# Patient Record
Sex: Male | Born: 1975 | Race: Black or African American | Hispanic: No | Marital: Single | Smoking: Current every day smoker
Health system: Southern US, Community
[De-identification: ages and names within clinical notes are randomized; demographics above are authoritative.]

## PROBLEM LIST (undated history)

## (undated) DIAGNOSIS — F319 Bipolar disorder, unspecified: Secondary | ICD-10-CM

## (undated) DIAGNOSIS — F32A Depression, unspecified: Secondary | ICD-10-CM

## (undated) DIAGNOSIS — F419 Anxiety disorder, unspecified: Secondary | ICD-10-CM

## (undated) DIAGNOSIS — F329 Major depressive disorder, single episode, unspecified: Secondary | ICD-10-CM

## (undated) DIAGNOSIS — I10 Essential (primary) hypertension: Secondary | ICD-10-CM

## (undated) DIAGNOSIS — R011 Cardiac murmur, unspecified: Secondary | ICD-10-CM

---

## 2009-09-09 ENCOUNTER — Emergency Department (HOSPITAL_COMMUNITY): Admission: EM | Admit: 2009-09-09 | Discharge: 2009-09-09 | Payer: Self-pay | Admitting: Internal Medicine

## 2009-11-21 ENCOUNTER — Emergency Department (HOSPITAL_COMMUNITY): Admission: EM | Admit: 2009-11-21 | Discharge: 2009-11-21 | Payer: Self-pay | Admitting: Emergency Medicine

## 2014-04-23 ENCOUNTER — Emergency Department: Payer: Self-pay | Admitting: Emergency Medicine
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
## 2018-02-27 ENCOUNTER — Emergency Department
Admission: EM | Admit: 2018-02-27 | Discharge: 2018-02-28 | Disposition: A | Discharge status: Other Acute Inpatient Hospital | Payer: Medicare Other | Attending: Emergency Medicine | Admitting: Emergency Medicine

## 2018-02-27 ENCOUNTER — Encounter: Payer: Self-pay | Admitting: Emergency Medicine

## 2018-02-27 DIAGNOSIS — F209 Schizophrenia, unspecified: Secondary | ICD-10-CM | POA: Diagnosis present

## 2018-02-27 DIAGNOSIS — F29 Unspecified psychosis not due to a substance or known physiological condition: Secondary | ICD-10-CM

## 2018-02-27 DIAGNOSIS — F1020 Alcohol dependence, uncomplicated: Secondary | ICD-10-CM | POA: Insufficient documentation

## 2018-02-27 DIAGNOSIS — F1210 Cannabis abuse, uncomplicated: Secondary | ICD-10-CM | POA: Insufficient documentation

## 2018-02-27 LAB — URINE DRUG SCREEN, QUALITATIVE (ARMC ONLY)
Amphetamines, Ur Screen: NOT DETECTED
BARBITURATES, UR SCREEN: NOT DETECTED
Benzodiazepine, Ur Scrn: NOT DETECTED
CANNABINOID 50 NG, UR ~~LOC~~: POSITIVE — AB
COCAINE METABOLITE, UR ~~LOC~~: NOT DETECTED
MDMA (Ecstasy)Ur Screen: NOT DETECTED
Methadone Scn, Ur: NOT DETECTED
OPIATE, UR SCREEN: NOT DETECTED
PHENCYCLIDINE (PCP) UR S: NOT DETECTED
Tricyclic, Ur Screen: NOT DETECTED

## 2018-02-27 LAB — CBC WITH DIFFERENTIAL/PLATELET
Abs Immature Granulocytes: 0.06 10*3/uL (ref 0.00–0.07)
BASOS PCT: 0 %
Basophils Absolute: 0.1 10*3/uL (ref 0.0–0.1)
EOS ABS: 0.1 10*3/uL (ref 0.0–0.5)
Eosinophils Relative: 1 %
HCT: 46.6 % (ref 39.0–52.0)
Hemoglobin: 15.7 g/dL (ref 13.0–17.0)
IMMATURE GRANULOCYTES: 0 %
Lymphocytes Relative: 27 %
Lymphs Abs: 3.7 10*3/uL (ref 0.7–4.0)
MCH: 30.4 pg (ref 26.0–34.0)
MCHC: 33.7 g/dL (ref 30.0–36.0)
MCV: 90.1 fL (ref 80.0–100.0)
MONOS PCT: 5 %
Monocytes Absolute: 0.8 10*3/uL (ref 0.1–1.0)
NEUTROS ABS: 9.1 10*3/uL — AB (ref 1.7–7.7)
NRBC: 0 % (ref 0.0–0.2)
Neutrophils Relative %: 67 %
PLATELETS: 317 10*3/uL (ref 150–400)
RBC: 5.17 MIL/uL (ref 4.22–5.81)
RDW: 16.6 % — AB (ref 11.5–15.5)
WBC: 13.9 10*3/uL — AB (ref 4.0–10.5)

## 2018-02-27 LAB — URINALYSIS, COMPLETE (UACMP) WITH MICROSCOPIC
BACTERIA UA: NONE SEEN
BILIRUBIN URINE: NEGATIVE
GLUCOSE, UA: NEGATIVE mg/dL
Ketones, ur: NEGATIVE mg/dL
Leukocytes, UA: NEGATIVE
Nitrite: NEGATIVE
PH: 5 (ref 5.0–8.0)
Protein, ur: NEGATIVE mg/dL
SPECIFIC GRAVITY, URINE: 1.002 — AB (ref 1.005–1.030)
Squamous Epithelial / LPF: NONE SEEN (ref 0–5)

## 2018-02-27 LAB — ETHANOL: Alcohol, Ethyl (B): 153 mg/dL — ABNORMAL HIGH (ref <10)

## 2018-02-27 LAB — COMPREHENSIVE METABOLIC PANEL
ALBUMIN: 4.1 g/dL (ref 3.5–5.0)
ALT: 18 U/L (ref 0–44)
ANION GAP: 12 (ref 5–15)
AST: 24 U/L (ref 15–41)
Alkaline Phosphatase: 86 U/L (ref 38–126)
BUN: 9 mg/dL (ref 6–20)
CALCIUM: 9.1 mg/dL (ref 8.9–10.3)
CHLORIDE: 107 mmol/L (ref 98–111)
CO2: 20 mmol/L — AB (ref 22–32)
Creatinine, Ser: 0.83 mg/dL (ref 0.61–1.24)
GFR calc non Af Amer: >60 mL/min (ref >60)
GLUCOSE: 105 mg/dL — AB (ref 70–99)
POTASSIUM: 3.7 mmol/L (ref 3.5–5.1)
SODIUM: 139 mmol/L (ref 135–145)
Total Bilirubin: 0.4 mg/dL (ref 0.3–1.2)
Total Protein: 8.8 g/dL — ABNORMAL HIGH (ref 6.5–8.1)

## 2018-02-27 LAB — ACETAMINOPHEN LEVEL: Acetaminophen (Tylenol), Serum: <10 ug/mL — ABNORMAL LOW (ref 10–30)

## 2018-02-27 LAB — SALICYLATE LEVEL

## 2018-02-27 MED ORDER — HALOPERIDOL LACTATE 5 MG/ML IJ SOLN: 5.0000 mg | Freq: Once | INTRAMUSCULAR | Status: DC

## 2018-02-27 MED ORDER — LORAZEPAM 2 MG/ML IJ SOLN: 2.0000 mg | Freq: Once | INTRAMUSCULAR | Status: DC

## 2018-02-27 MED ORDER — HALOPERIDOL LACTATE 5 MG/ML IJ SOLN
10.0000 mg | Freq: Once | INTRAMUSCULAR | Status: DC
Start: 2018-02-27 — End: 2018-02-27

## 2018-02-27 MED ORDER — LORAZEPAM 2 MG/ML IJ SOLN
2.0000 mg | Freq: Once | INTRAMUSCULAR | Status: AC
  Administered 2018-02-27: 2 mg via INTRAMUSCULAR

## 2018-02-27 MED ORDER — HALOPERIDOL LACTATE 5 MG/ML IJ SOLN
10.0000 mg | Freq: Once | INTRAMUSCULAR | Status: AC
  Administered 2018-02-27: 10 mg via INTRAMUSCULAR

## 2018-02-27 NOTE — ED Notes (Signed)
Hourly rounding reveals patient in room sleeping. No complaints, stable, in no acute distress. Q15 minute rounds and monitoring via Rover and Officer to continue.   

## 2018-02-27 NOTE — ED Notes (Addendum)
Pt dressed out by Bennie Hind, NT and [employer], RN. Belongings include: white t-shirt, pair of white socks, jeans, belt, pair of shoes and wallet. Pt refusing to take off underwear. Per Hewan, RN underwear will stay on pt for now. Pts belongings correctly labeled and put in secure locked area.

## 2018-02-27 NOTE — ED Notes (Signed)
Pt. Transferred from Triage to room after dressing out and screening for contraband. Pt. Oriented to Quad including Q15 minute rounds as well as Psychologist, counselling for their protection. Patient is alert and oriented, warm and dry in no acute distress. Unable to assess SI, HI, and AVH. Pt. Encouraged to let me know if needs arise.
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
## 2018-02-28 ENCOUNTER — Inpatient Hospital Stay
Admission: AD | Admit: 2018-02-28 | Discharge: 2018-03-06 | DRG: 885 | Disposition: A | Discharge status: Home / Self Care | Payer: Medicare Other | Attending: Psychiatry | Admitting: Psychiatry

## 2018-02-28 ENCOUNTER — Other Ambulatory Visit: Payer: Self-pay

## 2018-02-28 DIAGNOSIS — F209 Schizophrenia, unspecified: Secondary | ICD-10-CM | POA: Diagnosis present

## 2018-02-28 DIAGNOSIS — F1020 Alcohol dependence, uncomplicated: Secondary | ICD-10-CM | POA: Diagnosis present

## 2018-02-28 DIAGNOSIS — F1210 Cannabis abuse, uncomplicated: Secondary | ICD-10-CM | POA: Diagnosis present

## 2018-02-28 DIAGNOSIS — F201 Disorganized schizophrenia: Secondary | ICD-10-CM | POA: Diagnosis not present

## 2018-02-28 DIAGNOSIS — F17210 Nicotine dependence, cigarettes, uncomplicated: Secondary | ICD-10-CM | POA: Diagnosis present

## 2018-02-28 DIAGNOSIS — F200 Paranoid schizophrenia: Secondary | ICD-10-CM | POA: Diagnosis present

## 2018-02-28 DIAGNOSIS — F22 Delusional disorders: Secondary | ICD-10-CM | POA: Diagnosis present

## 2018-02-28 DIAGNOSIS — I10 Essential (primary) hypertension: Secondary | ICD-10-CM | POA: Diagnosis present

## 2018-02-28 DIAGNOSIS — Z79899 Other long term (current) drug therapy: Secondary | ICD-10-CM | POA: Diagnosis not present

## 2018-02-28 DIAGNOSIS — R45850 Homicidal ideations: Secondary | ICD-10-CM | POA: Diagnosis present

## 2018-02-28 HISTORY — DX: Bipolar disorder, unspecified: F31.9

## 2018-02-28 HISTORY — DX: Anxiety disorder, unspecified: F41.9

## 2018-02-28 HISTORY — DX: Cardiac murmur, unspecified: R01.1

## 2018-02-28 HISTORY — DX: Essential (primary) hypertension: I10

## 2018-02-28 HISTORY — DX: Depression, unspecified: F32.A

## 2018-02-28 HISTORY — DX: Major depressive disorder, single episode, unspecified: F32.9

## 2018-02-28 MED ORDER — ALUM & MAG HYDROXIDE-SIMETH 200-200-20 MG/5ML PO SUSP: 30.0000 mL | ORAL | Status: DC | PRN

## 2018-02-28 MED ORDER — MAGNESIUM HYDROXIDE 400 MG/5ML PO SUSP
30.0000 mL | Freq: Every day | ORAL | Status: DC | PRN
  Administered 2018-03-03: 30 mL via ORAL
  Filled 2018-02-28: qty 30

## 2018-02-28 MED ORDER — HALOPERIDOL 5 MG PO TABS: 5.0000 mg | ORAL_TABLET | Freq: Four times a day (QID) | ORAL | Status: DC | PRN

## 2018-02-28 MED ORDER — HALOPERIDOL 5 MG PO TABS
5.0000 mg | ORAL_TABLET | Freq: Three times a day (TID) | ORAL | Status: DC
  Filled 2018-02-28: qty 1

## 2018-02-28 MED ORDER — ACETAMINOPHEN 325 MG PO TABS
650.0000 mg | ORAL_TABLET | Freq: Four times a day (QID) | ORAL | Status: DC | PRN
  Administered 2018-03-01 – 2018-03-02 (×2): 650 mg via ORAL
  Filled 2018-02-28 (×2): qty 2

## 2018-02-28 MED ORDER — HYDROXYZINE HCL 25 MG PO TABS
25.0000 mg | ORAL_TABLET | Freq: Three times a day (TID) | ORAL | Status: DC | PRN
  Administered 2018-03-02 – 2018-03-03 (×2): 25 mg via ORAL
  Filled 2018-02-28 (×3): qty 1

## 2018-02-28 MED ORDER — HALOPERIDOL LACTATE 5 MG/ML IJ SOLN
5.0000 mg | Freq: Four times a day (QID) | INTRAMUSCULAR | Status: DC | PRN
  Filled 2018-02-28: qty 1

## 2018-02-28 MED ORDER — LORAZEPAM 2 MG PO TABS
2.0000 mg | ORAL_TABLET | Freq: Four times a day (QID) | ORAL | Status: DC | PRN
  Administered 2018-03-01: 2 mg via ORAL
  Filled 2018-02-28: qty 1

## 2018-02-28 MED ORDER — HALOPERIDOL 5 MG PO TABS
5.0000 mg | ORAL_TABLET | Freq: Three times a day (TID) | ORAL | Status: DC
  Administered 2018-03-01 – 2018-03-02 (×4): 5 mg via ORAL
  Filled 2018-02-28 (×5): qty 1

## 2018-02-28 MED ORDER — LORAZEPAM 2 MG/ML IJ SOLN
2.0000 mg | Freq: Four times a day (QID) | INTRAMUSCULAR | Status: DC | PRN
  Filled 2018-02-28: qty 1

## 2018-02-28 NOTE — Progress Notes (Signed)
Patient ID: Larry Walter, male   DOB: 1975-05-04, 43 y.o.   MRN: [id number] Per State regulations 482.30 this chart was reviewed for medical necessity with respect to the patient's admission/duration of stay.    Next review date: 03/04/2018  Thurman Coyer, BSN, RN-BC  Case Manager

## 2018-02-28 NOTE — ED Provider Notes (Signed)
-----------------------------------------   5:01 AM on 02/28/2018 -----------------------------------------   Blood pressure (!) 136/100, pulse (!) 103, temperature 97.7 F (36.5 C), temperature source Oral, resp. rate 18, SpO2 94 %.  The patient is calm and cooperative at this time.  There have been no acute events since the last update.  Awaiting disposition plan from Behavioral Medicine team.    Loleta Rose, MD 02/28/18 1324

## 2018-02-28 NOTE — ED Notes (Signed)
Hourly rounding reveals patient in room. No complaints, stable, in no acute distress. Q15 minute rounds and monitoring via Rover and Officer to continue.   

## 2018-02-28 NOTE — ED Notes (Signed)
Patient talking to Dr. Viviano Simas

## 2018-02-28 NOTE — BH Assessment (Signed)
Patient is to be admitted to [facility] by Dr. Viviano Simas.  Attending Physician will be Dr. Toni Amend.   Patient has been assigned to room 314, by [facility] Charge Nurse Gwen.   Intake Paper Work has been signed and placed on patient chart.  ER staff is aware of the admission:  Irving Burton, ER Secretary    Dr. Alphonzo Lemmings, ER MD   Geralynn Ochs, Patient's Nurse   Jacklyn Shell, Patient Access.

## 2018-02-28 NOTE — Tx Team (Signed)
Initial Treatment Plan 02/28/2018 11:36 PM Lysbeth Galas Chrisandra Carota [id number]    PATIENT STRESSORS: Financial difficulties Health problems Medication change or noncompliance Substance abuse   PATIENT STRENGTHS: Capable of independent living Physical Health Work skills   PATIENT IDENTIFIED PROBLEMS:   Drug and Alcohol miss use    Depression/Anxiety    Hopelessness/despair    Unemployed and broke       DISCHARGE CRITERIA:  Adequate post-discharge living arrangements Improved stabilization in mood, thinking, and/or behavior Medical problems require only outpatient monitoring Safe-care adequate arrangements made  PRELIMINARY DISCHARGE PLAN: Attend PHP/IOP Attend 12-step recovery group Outpatient therapy Participate in family therapy  PATIENT/FAMILY INVOLVEMENT: This treatment plan has been presented to and reviewed with the patient, Mourice Calmes,   The patient have been given the opportunity to ask questions and make suggestions.  Lelan Pons, RN 02/28/2018, 11:36 PM

## 2018-02-28 NOTE — ED Notes (Signed)
Spoke to [employer] from patients [facility] (ACT Team) and she stated patient received his Haldol Decanoate 100mg , February 12, 2018

## 2018-02-28 NOTE — ED Notes (Signed)
Patient refused his Haldol 5mg  PO medication, patient says he has already taken his Haldol injection this month, and will not let us (staff) keep pumping him up full of medication.

## 2018-02-28 NOTE — ED Notes (Signed)
This nurse and outgoing nurse spoke with patient about in-patient services.  Pt. Would rather go home, but asking to speak with doctor.  Dr. Informed will talk to patient.

## 2018-02-28 NOTE — ED Notes (Signed)
Hourly rounding reveals patient sleeping in room. No complaints, stable, in no acute distress. Q15 minute rounds and monitoring via Security to continue. 
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
## 2018-03-01 DIAGNOSIS — F200 Paranoid schizophrenia: Principal | ICD-10-CM

## 2018-03-01 MED ORDER — LORAZEPAM 2 MG PO TABS: 2.0000 mg | ORAL_TABLET | ORAL | Status: DC | PRN

## 2018-03-01 MED ORDER — HALOPERIDOL LACTATE 5 MG/ML IJ SOLN
5.0000 mg | Freq: Three times a day (TID) | INTRAMUSCULAR | Status: DC | PRN
  Filled 2018-03-01: qty 1

## 2018-03-01 MED ORDER — TRAZODONE HCL 100 MG PO TABS
100.0000 mg | ORAL_TABLET | Freq: Every evening | ORAL | Status: DC | PRN
  Administered 2018-03-01 – 2018-03-04 (×4): 100 mg via ORAL
  Filled 2018-03-01 (×4): qty 1

## 2018-03-01 MED ORDER — LORAZEPAM 2 MG/ML IJ SOLN
2.0000 mg | INTRAMUSCULAR | Status: DC | PRN
  Administered 2018-03-05: 2 mg via INTRAMUSCULAR
  Filled 2018-03-01: qty 1
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
## 2018-03-01 NOTE — Progress Notes (Signed)
Recreation Therapy Notes    Date: 03/01/2018  Time: 9:30 am   Location: Craft room   Behavioral response: N/A   Intervention Topic: Relaxation  Discussion/Intervention: Patient did not attend group.   Clinical Observations/Feedback:  Patient did not attend group.   Nickson Middlesworth LRT/CTRS          Raeford Brandenburg 03/01/2018 11:40 AM
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
## 2018-03-01 NOTE — Progress Notes (Addendum)
New admit to the unit, present under involuntary commitment by his Act team , patient is characterized by disorganized thinking ,present  emotional and behavioral out bust , speaks vulgar with F word  lack cooperation with care staff , demands the nurse to go and buy for him marijuana and beer before he can  cooperate with any thing, his affects  are often incongruent with current situation . Patient refused to contract for safety and refused to respond to any of assessment questions, rather displaying paranoia, stating that "I don't belong in here" "You all mother F..."  "don't mess with my head". Patient is placed in room 314 where he is safe and free of any harm to himself and safety of other residents. Patient has expresses thoughts of hurting people but does not have a plan  And states no intention of hurting anyone, patient is escorted to his room for deescalating and safety.

## 2018-03-01 NOTE — [facility] Group Notes (Signed)
Balance In Life 03/01/2018 1PM  Type of Therapy/Topic:  Group Therapy:  Balance in Life  Participation Level:  Did Not Attend  Description of Group:   This group will address the concept of balance and how it feels and looks when one is unbalanced. Patients will be encouraged to process areas in their lives that are out of balance and identify reasons for remaining unbalanced. Facilitators will guide patients in utilizing problem-solving interventions to address and correct the stressor making their life unbalanced. Understanding and applying boundaries will be explored and addressed for obtaining and maintaining a balanced life. Patients will be encouraged to explore ways to assertively make their unbalanced needs known to significant others in their lives, using other group members and facilitator for support and feedback.  Therapeutic Goals: 1. Patient will identify two or more emotions or situations they have that consume much of in their lives. 2. Patient will identify signs/triggers that life has become out of balance:  3. Patient will identify two ways to set boundaries in order to achieve balance in their lives:  4. Patient will demonstrate ability to communicate their needs through discussion and/or role plays  Summary of Patient Progress:    Therapeutic Modalities:   Cognitive Behavioral Therapy Solution-Focused Therapy Assertiveness Training  Larry Walter Larry Cristie Mckinney, LCSW   
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
## 2018-03-02 MED ORDER — HALOPERIDOL 5 MG PO TABS
10.0000 mg | ORAL_TABLET | Freq: Three times a day (TID) | ORAL | Status: DC
  Administered 2018-03-02 – 2018-03-05 (×9): 10 mg via ORAL
  Filled 2018-03-02 (×12): qty 2

## 2018-03-02 NOTE — Progress Notes (Signed)
Recreation Therapy Notes  Date: 03/02/2018  Time: 9:30 am   Location: Craft room   Behavioral response: N/A   Intervention Topic: Leisure  Discussion/Intervention: Patient did not attend group.   Clinical Observations/Feedback:  Patient did not attend group.   Kymberlyn Eckford LRT/CTRS        Meshilem Machuca 03/02/2018 10:31 AM

## 2018-03-02 NOTE — Progress Notes (Signed)
Patient alert and oriented x 4, denies SI/HI/AVH, patient's mood is irritable and he appears restless. Patient isolated to self in his room, he c/o of body pain and he was medicated with standing order as needed. Patient 's thoughts are organized coherent, he stated " l am really tired" . Patient was receptive to treatment plan and compliant with medication. 15 minutes safety checks was maintained will continue to monitor.

## 2018-03-02 NOTE — Plan of Care (Signed)
Pt. Complaint with medications. Pt. Denies si/hi/avh, contracts for safety. Pt. Forwards little and is minimal often. Pt. Participation in unit activities and groups absent. Pt. Attends meals alone and does not like to be around others.      Problem: Medication: Goal: Compliance with prescribed medication regimen will improve Outcome: Progressing   Problem: Self-Concept: Goal: Ability to disclose and discuss suicidal ideas will improve Outcome: Progressing   Problem: Coping: Goal: Will verbalize feelings Outcome: Not Progressing   Problem: Activity: Goal: Will identify at least one activity in which they can participate Outcome: Not Progressing

## 2018-03-02 NOTE — Progress Notes (Signed)
[facility] MD Progress Note  03/02/2018 7:02 PM Larry Walter  MRN:  [id number] Subjective: Follow-up for this patient with schizophrenia.  Patient met with treatment team today.  His thoughts are very disorganized and scattered.  Makes bizarre statements.  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  At the same time switching subjects to even more bizarre things.  Patient became agitated during the interview repeatedly stating that he wanted [place] to call him a cab so that he could get out of the hospital immediately.  At one point started cracking his knuckles and looking like he might actually become aggressive we needed to end the interview.  Fortunately he is currently cooperative with the Haldol and is not showing any side effects from it. Principal Problem: Schizophrenia ([place]) Diagnosis: Principal Problem:   Schizophrenia ([place]) Active Problems:   Marijuana abuse, continuous   Alcohol use disorder, severe, dependence ([place])  Total Time spent with patient: 30 minutes  Past Psychiatric History: Patient has a history of long-standing schizophrenia.  Recent decompensation possibly related to drug abuse although that also seems to be a chronic issue.  Has been more paranoid and agitated recently.  Past Medical History:  Past Medical History:  Diagnosis Date  . Anxiety   . Bipolar disorder ([place])   . Depression   . Heart murmur   . Hypertension    History reviewed. No pertinent surgical history. Family History: History reviewed. No pertinent family history. Family Psychiatric  History: None known Social History:  Social History   Substance and Sexual Activity  Alcohol Use Not on file     Social History   Substance and Sexual Activity  Drug Use Not on file    Social History   Socioeconomic History  . Marital status: Single    Spouse name: Not on file  . Number of children: Not on file  . Years of education: Not on file  . Highest  education level: Not on file  Occupational History  . Not on file  Social Needs  . Financial resource strain: Not on file  . Food insecurity:    Worry: Not on file    Inability: Not on file  . Transportation needs:    Medical: Not on file    Non-medical: Not on file  Tobacco Use  . Smoking status: Current Every Day Smoker  . Smokeless tobacco: Former Network engineer and Sexual Activity  . Alcohol use: Not on file  . Drug use: Not on file  . Sexual activity: Not on file  Lifestyle  . Physical activity:    Days per week: Not on file    Minutes per session: Not on file  . Stress: Not on file  Relationships  . Social connections:    Talks on phone: Not on file    Gets together: Not on file    Attends religious service: Not on file    Active member of club or organization: Not on file    Attends meetings of clubs or organizations: Not on file    Relationship status: Not on file  Other Topics Concern  . Not on file  Social History Narrative  . Not on file   Additional Social History:                         Sleep: Fair  Appetite:  Fair  Current Medications: Current Facility-Administered Medications  Medication Dose  Route Frequency Provider Last Rate Last Dose  . acetaminophen (TYLENOL) tablet 650 mg  650 mg Oral Q6H PRN Lavella Hammock, MD   650 mg at 03/02/18 1038  . alum & mag hydroxide-simeth (MAALOX/MYLANTA) 200-200-20 MG/5ML suspension 30 mL  30 mL Oral Q4H PRN Lavella Hammock, MD      . haloperidol (HALDOL) tablet 10 mg  10 mg Oral TID , Madie Reno, MD   10 mg at 03/02/18 1657  . haloperidol (HALDOL) tablet 5 mg  5 mg Oral Q6H PRN Lavella Hammock, MD       Or  . haloperidol lactate (HALDOL) injection 5 mg  5 mg Intramuscular Q6H PRN Lavella Hammock, MD      . haloperidol lactate (HALDOL) injection 5 mg  5 mg Intramuscular TID PRN , Madie Reno, MD      . hydrOXYzine (ATARAX/VISTARIL) tablet 25 mg  25 mg Oral TID PRN Lavella Hammock, MD      .  LORazepam (ATIVAN) tablet 2 mg  2 mg Oral Q6H PRN Lavella Hammock, MD   2 mg at 03/01/18 2118   Or  . LORazepam (ATIVAN) injection 2 mg  2 mg Intramuscular Q6H PRN Lavella Hammock, MD      . LORazepam (ATIVAN) tablet 2 mg  2 mg Oral Q4H PRN , Madie Reno, MD       Or  . LORazepam (ATIVAN) injection 2 mg  2 mg Intramuscular Q4H PRN ,  T, MD      . magnesium hydroxide (MILK OF MAGNESIA) suspension 30 mL  30 mL Oral Daily PRN Lavella Hammock, MD      . traZODone (DESYREL) tablet 100 mg  100 mg Oral QHS PRN , Madie Reno, MD   100 mg at 03/01/18 2118    Lab Results: No results found for this or any previous visit (from the past 48 hour(s)).  Blood Alcohol level:  Lab Results  Component Value Date   ETH 153 (H) 95/63/8756    Metabolic Disorder Labs: No results found for: HGBA1C, MPG No results found for: PROLACTIN No results found for: CHOL, TRIG, HDL, CHOLHDL, VLDL, LDLCALC  Physical Findings: AIMS: Facial and Oral Movements Muscles of Facial Expression: None, normal Lips and Perioral Area: None, normal Jaw: None, normal Tongue: None, normal,Extremity Movements Upper (arms, wrists, hands, fingers): None, normal Lower (legs, knees, ankles, toes): None, normal, Trunk Movements Neck, shoulders, hips: None, normal, Overall Severity Severity of abnormal movements (highest score from questions above): None, normal Incapacitation due to abnormal movements: None, normal Patient's awareness of abnormal movements (rate only patient's report): No Awareness, Dental Status Current problems with teeth and/or dentures?: No Does patient usually wear dentures?: No  CIWA:  CIWA-Ar Total: 2 COWS:  COWS Total Score: 1  Musculoskeletal: Strength & Muscle Tone: within normal limits Gait & Station: normal Patient leans: N/A  Psychiatric Specialty Exam: Physical Exam  Nursing note and vitals reviewed. Constitutional: He appears well-developed and well-nourished.  HENT:  Head:  Normocephalic and atraumatic.  Eyes: Pupils are equal, round, and reactive to light. Conjunctivae are normal.  Neck: Normal range of motion.  Cardiovascular: Regular rhythm and normal heart sounds.  Respiratory: Effort normal. No respiratory distress.  GI: Soft.  Musculoskeletal: Normal range of motion.  Neurological: He is alert.  Skin: Skin is warm and dry.  Psychiatric: His affect is labile and inappropriate. His speech is tangential. He is agitated and aggressive. Thought content is paranoid and delusional.  Cognition and memory are impaired. He expresses impulsivity and inappropriate judgment. He is noncommunicative.    Review of Systems  Constitutional: Negative.   HENT: Negative.   Eyes: Negative.   Respiratory: Negative.   Cardiovascular: Negative.   Gastrointestinal: Negative.   Musculoskeletal: Negative.   Skin: Negative.   Neurological: Negative.   Psychiatric/Behavioral: Negative.     Blood pressure 112/80, pulse (!) 120, temperature 98.2 F (36.8 C), temperature source Oral, resp. rate 16, height '6\' 1"'  (1.854 m), weight 83.9 kg, SpO2 99 %.Body mass index is 24.41 kg/m.  General Appearance: Casual  Eye Contact:  Fair  Speech:  Garbled and Slurred  Volume:  Increased  Mood:  Angry and Irritable  Affect:  Congruent  Thought Process:  Disorganized  Orientation:  Negative  Thought Content:  Illogical, Delusions, Paranoid Ideation and Rumination  Suicidal Thoughts:  No  Homicidal Thoughts:  Yes.  without intent/plan  Memory:  Immediate;   Fair Recent;   Poor Remote;   Poor  Judgement:  Impaired  Insight:  Lacking  Psychomotor Activity:  Restlessness  Concentration:  Concentration: Fair  Recall:  [employer] of Knowledge:  Fair  Language:  Fair  Akathisia:  No  Handed:  Right  AIMS (if indicated):     Assets:  Desire for Improvement Housing Physical Health Resilience  ADL's:  Intact  Cognition:  Impaired,  Mild  Sleep:  Number of Hours: 7.5      Treatment Plan Summary: Daily contact with patient to assess and evaluate symptoms and progress in treatment, Medication management and Plan Patient was schizophrenia remains disorganized paranoid agitated and at times slightly threatening although he is not getting involved in any aggressive behavior during the day today.  I have doubled his Haldol dose to 10 mg 3 times a day which he seems to be tolerating without any difficulty.  Patient has made a clear many times that he does not want to take any other medicines than this so given his agitation I did not broach the subject.  Patient is going to need to be in the hospital at least well into next week I would guess.  Alethia Berthold, MD 03/02/2018, 7:02 PM
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
## 2018-03-02 NOTE — Tx Team (Addendum)
Interdisciplinary Treatment and Diagnostic Plan Update  03/02/2018 Time of Session:2:00pm Tritan Riegsecker MRN: [id number]  Principal Diagnosis: Schizophrenia [facility])  Secondary Diagnoses: Principal Problem:   Schizophrenia (HCC) Active Problems:   Marijuana abuse, continuous   Alcohol use disorder, severe, dependence (HCC)   Current Medications:  Current Facility-Administered Medications  Medication Dose Route Frequency Provider Last Rate Last Dose  . acetaminophen (TYLENOL) tablet 650 mg  650 mg Oral Q6H PRN Mariel Craft, MD   650 mg at 03/02/18 1038  . alum & mag hydroxide-simeth (MAALOX/MYLANTA) 200-200-20 MG/5ML suspension 30 mL  30 mL Oral Q4H PRN Mariel Craft, MD      . haloperidol (HALDOL) tablet 10 mg  10 mg Oral TID Clapacs, Jackquline Denmark, MD      . haloperidol (HALDOL) tablet 5 mg  5 mg Oral Q6H PRN Mariel Craft, MD       Or  . haloperidol lactate (HALDOL) injection 5 mg  5 mg Intramuscular Q6H PRN Mariel Craft, MD      . haloperidol lactate (HALDOL) injection 5 mg  5 mg Intramuscular TID PRN Clapacs, Jackquline Denmark, MD      . hydrOXYzine (ATARAX/VISTARIL) tablet 25 mg  25 mg Oral TID PRN Mariel Craft, MD      . LORazepam (ATIVAN) tablet 2 mg  2 mg Oral Q6H PRN Mariel Craft, MD   2 mg at 03/01/18 2118   Or  . LORazepam (ATIVAN) injection 2 mg  2 mg Intramuscular Q6H PRN Mariel Craft, MD      . LORazepam (ATIVAN) tablet 2 mg  2 mg Oral Q4H PRN Clapacs, Jackquline Denmark, MD       Or  . LORazepam (ATIVAN) injection 2 mg  2 mg Intramuscular Q4H PRN Clapacs, John T, MD      . magnesium hydroxide (MILK OF MAGNESIA) suspension 30 mL  30 mL Oral Daily PRN Mariel Craft, MD      . traZODone (DESYREL) tablet 100 mg  100 mg Oral QHS PRN Clapacs, Jackquline Denmark, MD   100 mg at 03/01/18 2118   PTA Medications: No medications prior to admission.    Patient Stressors: Financial difficulties Health problems Medication change or noncompliance Substance abuse  Patient Strengths:  Capable of independent living Physical Health Work skills  Treatment Modalities: Medication Management, Group therapy, Case management,  1 to 1 session with clinician, Psychoeducation, Recreational therapy.   Physician Treatment Plan for Primary Diagnosis: Schizophrenia (HCC) Long Term Goal(s): Improvement in symptoms so as ready for discharge Improvement in symptoms so as ready for discharge   Short Term Goals: Ability to verbalize feelings will improve Ability to demonstrate self-control will improve Compliance with prescribed medications will improve  Medication Management: Evaluate patient's response, side effects, and tolerance of medication regimen.  Therapeutic Interventions: 1 to 1 sessions, Unit Group sessions and Medication administration.  Evaluation of Outcomes: Not Progressing  Physician Treatment Plan for Secondary Diagnosis: Principal Problem:   Schizophrenia (HCC) Active Problems:   Marijuana abuse, continuous   Alcohol use disorder, severe, dependence (HCC)  Long Term Goal(s): Improvement in symptoms so as ready for discharge Improvement in symptoms so as ready for discharge   Short Term Goals: Ability to verbalize feelings will improve Ability to demonstrate self-control will improve Compliance with prescribed medications will improve     Medication Management: Evaluate patient's response, side effects, and tolerance of medication regimen.  Therapeutic Interventions: 1 to 1 sessions, Unit Group sessions and  Medication administration.  Evaluation of Outcomes: Not Progressing   RN Treatment Plan for Primary Diagnosis: Schizophrenia (HCC) Long Term Goal(s): Knowledge of disease and therapeutic regimen to maintain health will improve  Short Term Goals: Ability to verbalize frustration and anger appropriately will improve, Ability to identify and develop effective coping behaviors will improve and Compliance with prescribed medications will  improve  Medication Management: RN will administer medications as ordered by provider, will assess and evaluate patient's response and provide education to patient for prescribed medication. RN will report any adverse and/or side effects to prescribing provider.  Therapeutic Interventions: 1 on 1 counseling sessions, Psychoeducation, Medication administration, Evaluate responses to treatment, Monitor vital signs and CBGs as ordered, Perform/monitor CIWA, COWS, AIMS and Fall Risk screenings as ordered, Perform wound care treatments as ordered.  Evaluation of Outcomes: Not Progressing   LCSW Treatment Plan for Primary Diagnosis: Schizophrenia (HCC) Long Term Goal(s): Safe transition to appropriate next level of care at discharge, Engage patient in therapeutic group addressing interpersonal concerns.  Short Term Goals: Engage patient in aftercare planning with referrals and resources, Increase social support, Increase ability to appropriately verbalize feelings and Increase skills for wellness and recovery  Therapeutic Interventions: Assess for all discharge needs, 1 to 1 time with Social worker, Explore available resources and support systems, Assess for adequacy in community support network, Educate family and significant other(s) on suicide prevention, Complete Psychosocial Assessment, Interpersonal group therapy.  Evaluation of Outcomes: Not Progressing   Progress in Treatment: Attending groups: No. Participating in groups: No. Taking medication as prescribed: Yes. Toleration medication: Yes. Family/Significant other contact made: No, will contact:  Once CSW obtains permission from pt.   Patient understands diagnosis: No. Discussing patient identified problems/goals with staff: Yes. Medical problems stabilized or resolved: Yes. Denies suicidal/homicidal ideation: Yes. Issues/concerns per patient self-inventory: No. Other: none    New problem(s) identified: No, Describe:  none  New  Short Term/Long Term Goal(s):  Patient Goals:  "getting a cab and getting out of here"  Discharge Plan or Barriers:   Reason for Continuation of Hospitalization: Aggression Delusions  Hallucinations Medication stabilization  Estimated Length of Stay:  Recreational Therapy: Patient Stressors: N/A  Patient Goal: Patient will identify 3 healthy leisure activities that can be utilized post d/c within 5 recreation therapy group sessions  Attendees: Patient: Tobi BastosKenneth Dinino 03/02/2018 3:19 PM  Physician: Dr. Toni Amendlapacs, MD 03/02/2018 3:19 PM  Nursing:  03/02/2018 3:19 PM  RN Care Manager: 03/02/2018 3:19 PM  Social Worker:  Penni HomansMichaela Stanfield, MSW, LCSW 03/02/2018 3:19 PM  Recreational Therapist: Hilbert BibleShay Miya Luviano, CTRS, LRT 03/02/2018 3:19 PM  Other:  03/02/2018 3:19 PM  Other:  03/02/2018 3:19 PM  Other: 03/02/2018 3:19 PM    Scribe for Treatment Team: Harden MoMichaela J Stanfield, LCSW 03/02/2018 3:19 PM

## 2018-03-02 NOTE — Plan of Care (Signed)
  Problem: Education: Goal: Utilization of techniques to improve thought processes will improve Outcome: Progressing  Patient able to express thoughts to staff.

## 2018-03-03 DIAGNOSIS — F209 Schizophrenia, unspecified: Secondary | ICD-10-CM

## 2018-03-03 NOTE — Progress Notes (Signed)
[facility] MD Progress Note  03/03/2018 3:00 PM Larry Walter  MRN:  [id number] Subjective: Follow-up for this patient with schizophrenia.    Pt reportedly isolative, labile, irrigable. Pt  Lysing in bed, dysphoric., guarded, makes poor eye contact, denies AVH, Denies SI/HI, pt upset with staff checking him at night disturbing his sleep.   Delusional about having  a lot of money.  Principal Problem: Schizophrenia (HCC) Diagnosis: Principal Problem:   Schizophrenia (HCC) Active Problems:   Marijuana abuse, continuous   Alcohol use disorder, severe, dependence (HCC)  Total Time spent with patient: 25 min  Past Psychiatric History: Patient has a history of long-standing schizophrenia.  Recent decompensation possibly related to drug abuse although that also seems to be a chronic issue.  Has been more paranoid and agitated recently.  Past Medical History:  Past Medical History:  Diagnosis Date  . Anxiety   . Bipolar disorder (HCC)   . Depression   . Heart murmur   . Hypertension    History reviewed. No pertinent surgical history. Family History: History reviewed. No pertinent family history. Family Psychiatric  History: None known Social History:  Social History   Substance and Sexual Activity  Alcohol Use Not on file     Social History   Substance and Sexual Activity  Drug Use Not on file    Social History   Socioeconomic History  . Marital status: Single    Spouse name: Not on file  . Number of children: Not on file  . Years of education: Not on file  . Highest education level: Not on file  Occupational History  . Not on file  Social Needs  . Financial resource strain: Not on file  . Food insecurity:    Worry: Not on file    Inability: Not on file  . Transportation needs:    Medical: Not on file    Non-medical: Not on file  Tobacco Use  . Smoking status: Current Every Day Smoker  . Smokeless tobacco: Former Engineer, water and Sexual Activity  . Alcohol  use: Not on file  . Drug use: Not on file  . Sexual activity: Not on file  Lifestyle  . Physical activity:    Days per week: Not on file    Minutes per session: Not on file  . Stress: Not on file  Relationships  . Social connections:    Talks on phone: Not on file    Gets together: Not on file    Attends religious service: Not on file    Active member of club or organization: Not on file    Attends meetings of clubs or organizations: Not on file    Relationship status: Not on file  Other Topics Concern  . Not on file  Social History Narrative  . Not on file   Additional Social History:                         Sleep: Fair  Appetite:  Fair  Current Medications: Current Facility-Administered Medications  Medication Dose Route Frequency Provider Last Rate Last Dose  . acetaminophen (TYLENOL) tablet 650 mg  650 mg Oral Q6H PRN Mariel Craft, MD   650 mg at 03/02/18 1038  . alum & mag hydroxide-simeth (MAALOX/MYLANTA) 200-200-20 MG/5ML suspension 30 mL  30 mL Oral Q4H PRN Mariel Craft, MD      . haloperidol (HALDOL) tablet 10 mg  10 mg Oral TID Clapacs, John  T, MD   10 mg at 03/03/18 1201  . haloperidol (HALDOL) tablet 5 mg  5 mg Oral Q6H PRN Mariel CraftMaurer, Sheila M, MD       Or  . haloperidol lactate (HALDOL) injection 5 mg  5 mg Intramuscular Q6H PRN Mariel CraftMaurer, Sheila M, MD      . haloperidol lactate (HALDOL) injection 5 mg  5 mg Intramuscular TID PRN Clapacs, Jackquline DenmarkJohn T, MD      . hydrOXYzine (ATARAX/VISTARIL) tablet 25 mg  25 mg Oral TID PRN Mariel CraftMaurer, Sheila M, MD   25 mg at 03/02/18 2150  . LORazepam (ATIVAN) tablet 2 mg  2 mg Oral Q6H PRN Mariel CraftMaurer, Sheila M, MD   2 mg at 03/01/18 2118   Or  . LORazepam (ATIVAN) injection 2 mg  2 mg Intramuscular Q6H PRN Mariel CraftMaurer, Sheila M, MD      . LORazepam (ATIVAN) tablet 2 mg  2 mg Oral Q4H PRN Clapacs, Jackquline DenmarkJohn T, MD       Or  . LORazepam (ATIVAN) injection 2 mg  2 mg Intramuscular Q4H PRN Clapacs, John T, MD      . magnesium hydroxide  (MILK OF MAGNESIA) suspension 30 mL  30 mL Oral Daily PRN Mariel CraftMaurer, Sheila M, MD   30 mL at 03/03/18 1317  . traZODone (DESYREL) tablet 100 mg  100 mg Oral QHS PRN Clapacs, Jackquline DenmarkJohn T, MD   100 mg at 03/02/18 2150    Lab Results: No results found for this or any previous visit (from the past 48 hour(s)).  Blood Alcohol level:  Lab Results  Component Value Date   ETH 153 (H) 02/27/2018    Metabolic Disorder Labs: No results found for: HGBA1C, MPG No results found for: PROLACTIN No results found for: CHOL, TRIG, HDL, CHOLHDL, VLDL, LDLCALC  Physical Findings: AIMS: Facial and Oral Movements Muscles of Facial Expression: None, normal Lips and Perioral Area: None, normal Jaw: None, normal Tongue: None, normal,Extremity Movements Upper (arms, wrists, hands, fingers): None, normal Lower (legs, knees, ankles, toes): None, normal, Trunk Movements Neck, shoulders, hips: None, normal, Overall Severity Severity of abnormal movements (highest score from questions above): None, normal Incapacitation due to abnormal movements: None, normal Patient's awareness of abnormal movements (rate only patient's report): No Awareness, Dental Status Current problems with teeth and/or dentures?: No Does patient usually wear dentures?: No  CIWA:  CIWA-Ar Total: 2 COWS:  COWS Total Score: 1  Musculoskeletal: Strength & Muscle Tone: within normal limits Gait & Station: normal Patient leans: N/A  Psychiatric Specialty Exam: Physical Exam  Nursing note and vitals reviewed. Constitutional: He appears well-developed and well-nourished.  HENT:  Head: Normocephalic and atraumatic.  Cardiovascular: Regular rhythm and normal heart sounds.  Respiratory: Effort normal. No respiratory distress.  GI: Soft.  Neurological: He is alert.  Psychiatric: His affect is labile and inappropriate. His speech is tangential. He is agitated and aggressive. Thought content is paranoid and delusional. Cognition and memory are  impaired. He expresses impulsivity and inappropriate judgment. He is noncommunicative.    Review of Systems  Constitutional: Negative.   HENT: Negative.   Eyes: Negative.   Respiratory: Negative.   Cardiovascular: Negative.   Gastrointestinal: Negative.   Musculoskeletal: Negative.   Skin: Negative.   Neurological: Negative.   Psychiatric/Behavioral: Negative.     Blood pressure 106/77, pulse 98, temperature 98.9 F (37.2 C), temperature source Oral, resp. rate 16, height 6\' 1"  (1.854 m), weight 83.9 kg, SpO2 98 %.Body mass index is 24.41 kg/m.  General  Appearance: Casual  Eye Contact:  Fair  Speech:  Garbled and Slurred  Volume:  Increased  Mood:  Angry and Irritable  Affect:  Congruent  Thought Process:  Disorganized  Orientation:  Negative  Thought Content:  Illogical, Delusions, Paranoid Ideation and Rumination, delusional  Suicidal Thoughts:  No  Homicidal Thoughts:  denies  Memory:  Immediate;   Fair Recent;   Poor Remote;   Poor  Judgement:  Impaired  Insight:  Lacking  Psychomotor Activity: normal  Concentration:  Concentration: Fair  Recall:  Fis[employer]Fair  Fund of Knowledge:  Fair  Language:  Fair  Akathisia:  No  Handed:  Right  AIMS (if indicated):     Assets:  Desire for Improvement Housing Physical Health Resilience  ADL's:  Intact  Cognition:  Impaired,  Mild  Sleep:  Number of Hours: 6.15     Treatment Plan Summary:  Pt with schizophrenia, still psychotic delusional, labile mood. Plan-  Cont haldol. EKG pending Group/milieu tx as tolerated.   Beverly SessionsJagannath Aleera Gilcrease, MD 03/03/2018, 3:00 PM

## 2018-03-03 NOTE — Plan of Care (Signed)
Pt. Complaint with medications. Pt. Denies si/hi/avh, contracts for safety. Pt. Reports he can remain safe while on the unit and if he discharges.    Problem: Medication: Goal: Compliance with prescribed medication regimen will improve Outcome: Progressing   Problem: Self-Concept: Goal: Ability to disclose and discuss suicidal ideas will improve Outcome: Progressing

## 2018-03-03 NOTE — [facility] Group Notes (Signed)
LCSW Group Therapy Note  03/03/2018 1:15pm  Type of Therapy and Topic:  Group Therapy:  Fears and Unhealthy Coping Skills  Participation Level:  Did Not Attend   Description of Group:  The focus of this group was to discuss some of the prevalent fears that patients experience, and to identify the commonalities among group members.  An exercise was used to initiate the discussion, followed by writing on the white board a group-generated list of unhealthy coping and healthy coping techniques to deal with each fear.    Therapeutic Goals: 1. Patient will identify and describe 3 fears they experience 2. Patient will identify one positive coping strategy for each fear they experience 3. Patient will respond empathically to peers statements regarding fears they experience  Summary of Patient Progress:  The patient was invited to group and did not attend.    Therapeutic Modalities Cognitive Behavioral Therapy Motivational Interviewing  Raequon Catanzaro  CUEBAS-COLON, LCSW 03/03/2018 12:22 PM

## 2018-03-03 NOTE — Plan of Care (Signed)
  Problem: Education: Goal: Utilization of techniques to improve thought processes will improve Outcome: Progressing Goal: Knowledge of the prescribed therapeutic regimen will improve Outcome: Progressing   Problem: Coping: Goal: Coping ability will improve Outcome: Progressing Goal: Will verbalize feelings Outcome: Progressing   Problem: Medication: Goal: Compliance with prescribed medication regimen will improve Outcome: Progressing   Problem: Self-Concept: Goal: Ability to disclose and discuss suicidal ideas will improve Outcome: Progressing Goal: Will verbalize positive feelings about self Outcome: Progressing

## 2018-03-03 NOTE — Progress Notes (Signed)
D: Pt denies SI/HI/AV hallucinations. Pt has been ambulating in hall and room. Patient has been in milieu with peers and staff. Patient is somewhat guarded but cooperative. A: Pt was offered support and encouragement. Pt was given scheduled medications. Pt was encourage to attend groups. Q 15 minute checks were done for safety.  R:Pt attends groups and interacts well with peers and staff. Pt is taking medication. Pt has no complaints.Pt receptive to treatment and safety maintained on unit.

## 2018-03-03 NOTE — Progress Notes (Signed)
Patient is responding well to treatment regimen , calm and verbalizing positive feeling about self , showing an increase in self esteem and self worth,  minimal participation and socialization with peers. Take his medications no side effects noted.contract for safety and denies any SI/HI/AVH, sleep is continuous and 15 minutes safety checks is maintained.

## 2018-03-03 NOTE — Progress Notes (Signed)
D: Pt denies SI/HI/AVH, can contract for safety. Pt is pleasant and cooperative, but minimal and forwards little. Pt. Complains of not being able to have a, "good size poop" and requests PRN medication for this. Pt. Later in the shift reports he was able to have a large bowel movement. Patient Interactions are minimal. Pt. Affect brighter overall, smiling occassionally. Pt. Continues to be discharge fixated.    A: Q x 15 minute observation checks were completed for safety. Patient was provided with education, but needs reinforcement. Patient was given/offered medications per orders. Patient  was encourage to attend groups, participate in unit activities and continue with plan of care. Pt. Chart and plans of care reviewed. Pt. Given support and encouragement.   R: Patient is complaint with medications and attends all meals, eating good. Pt. Less isolative and withdrawn from peers when attending meals. Pt. Does continues to not attend groups or unit activities. Pt. Continues to pace and/or spend a majority of his time isolative and withdrawn.             Precautionary checks every 15 minutes for safety maintained, room free of safety hazards, patient sustains no injury or falls during this shift. Will endorse care to next shift.

## 2018-03-04 NOTE — Progress Notes (Signed)
D- Patient alert and oriented. Patient presents in a pleasant mood on assessment stating that he slept "good enough" last night and had no major complaints or concerns to voice to this Clinical research associate. Patient denies any signs/symptoms of depression and anxiety to this Clinical research associate. Patient also denies SI, HI, AVH, and pain at this time. Patient's goal for today is to "mind my business".  A- Scheduled medications administered to patient, per MD orders. Support and encouragement provided.  Routine safety checks conducted every 15 minutes.  Patient informed to notify staff with problems or concerns.  R- No adverse drug reactions noted. Patient contracts for safety at this time. Patient compliant with medications and treatment plan. Patient receptive, calm, and cooperative. Patient interacts well with others on the unit.  Patient remains safe at this time.

## 2018-03-04 NOTE — [facility] Group Notes (Signed)
LCSW Group Therapy Note 03/04/2018 1:15pm  Type of Therapy and Topic: Group Therapy: Feelings Around Returning Home & Establishing a Supportive Framework and Supporting Oneself When Supports Not Available  Participation Level: Did Not Attend  Description of Group:  Patients first processed thoughts and feelings about upcoming discharge. These included fears of upcoming changes, lack of change, new living environments, judgements and expectations from others and overall stigma of mental health issues. The group then discussed the definition of a supportive framework, what that looks and feels like, and how do to discern it from an unhealthy non-supportive network. The group identified different types of supports as well as what to do when your family/friends are less than helpful or unavailable  Therapeutic Goals  1. Patient will identify one healthy supportive network that they can use at discharge. 2. Patient will identify one factor of a supportive framework and how to tell it from an unhealthy network. 3. Patient able to identify one coping skill to use when they do not have positive supports from others. 4. Patient will demonstrate ability to communicate their needs through discussion and/or role plays.  Summary of Patient Progress:  Pt was invited to attend group but chose not to attend. CSW will continue to encourage pt to attend group throughout their admission.    Therapeutic Modalities Cognitive Behavioral Therapy Motivational Interviewing   Larry Walter  CUEBAS-COLON, LCSW 03/04/2018 9:57 AM  

## 2018-03-04 NOTE — Plan of Care (Signed)
Pt. Verbalizes feeling appropriately. Pt. Reports normal mood. Pt. Complaint with medications. Pt. Denies si/hi/avh, can contract for safety.    Problem: Coping: Goal: Will verbalize feelings Outcome: Progressing   Problem: Medication: Goal: Compliance with prescribed medication regimen will improve Outcome: Progressing   Problem: Self-Concept: Goal: Ability to disclose and discuss suicidal ideas will improve Outcome: Progressing

## 2018-03-04 NOTE — Progress Notes (Signed)
Patient ID: Larry Walter, male   DOB: January 22, 1976, 43 y.o.   MRN: [id number] Per State regulations 482.30 this chart was reviewed for medical necessity with respect to the patient's admission/duration of stay.    Next review date:03/08/2018  Thurman Coyer, BSN, RN-BC  Case Manager

## 2018-03-04 NOTE — Progress Notes (Signed)
[facility] MD Progress Note  03/04/2018 1:18 PM Larry Walter  MRN:  [id number] Subjective: Follow-up for this patient with schizophrenia.   " Im doing fine, Im going home tomorrow" Pt reportedly isolative, mood and psychosis improving slowly,  guarded,  denies AVH, Denies SI/HI,   Principal Problem: Schizophrenia (HCC) Diagnosis: Principal Problem:   Schizophrenia (HCC) Active Problems:   Marijuana abuse, continuous   Alcohol use disorder, severe, dependence (HCC)  Total Time spent with patient: 25 min  Past Psychiatric History: Patient has a history of long-standing schizophrenia.  Recent decompensation possibly related to drug abuse although that also seems to be a chronic issue.  Has been more paranoid and agitated recently.  Past Medical History:  Past Medical History:  Diagnosis Date  . Anxiety   . Bipolar disorder (HCC)   . Depression   . Heart murmur   . Hypertension    History reviewed. No pertinent surgical history. Family History: History reviewed. No pertinent family history. Family Psychiatric  History: None known Social History:  Social History   Substance and Sexual Activity  Alcohol Use Not on file     Social History   Substance and Sexual Activity  Drug Use Not on file    Social History   Socioeconomic History  . Marital status: Single    Spouse name: Not on file  . Number of children: Not on file  . Years of education: Not on file  . Highest education level: Not on file  Occupational History  . Not on file  Social Needs  . Financial resource strain: Not on file  . Food insecurity:    Worry: Not on file    Inability: Not on file  . Transportation needs:    Medical: Not on file    Non-medical: Not on file  Tobacco Use  . Smoking status: Current Every Day Smoker  . Smokeless tobacco: Former Engineer, water and Sexual Activity  . Alcohol use: Not on file  . Drug use: Not on file  . Sexual activity: Not on file  Lifestyle  . Physical  activity:    Days per week: Not on file    Minutes per session: Not on file  . Stress: Not on file  Relationships  . Social connections:    Talks on phone: Not on file    Gets together: Not on file    Attends religious service: Not on file    Active member of club or organization: Not on file    Attends meetings of clubs or organizations: Not on file    Relationship status: Not on file  Other Topics Concern  . Not on file  Social History Narrative  . Not on file   Additional Social History:                         Sleep: Fair  Appetite:  Fair  Current Medications: Current Facility-Administered Medications  Medication Dose Route Frequency Provider Last Rate Last Dose  . acetaminophen (TYLENOL) tablet 650 mg  650 mg Oral Q6H PRN Mariel Craft, MD   650 mg at 03/02/18 1038  . alum & mag hydroxide-simeth (MAALOX/MYLANTA) 200-200-20 MG/5ML suspension 30 mL  30 mL Oral Q4H PRN Mariel Craft, MD      . haloperidol (HALDOL) tablet 10 mg  10 mg Oral TID Clapacs, Jackquline Denmark, MD   10 mg at 03/04/18 1131  . haloperidol (HALDOL) tablet 5 mg  5  mg Oral Q6H PRN Mariel CraftMaurer, Sheila M, MD       Or  . haloperidol lactate (HALDOL) injection 5 mg  5 mg Intramuscular Q6H PRN Mariel CraftMaurer, Sheila M, MD      . haloperidol lactate (HALDOL) injection 5 mg  5 mg Intramuscular TID PRN Clapacs, Jackquline DenmarkJohn T, MD      . hydrOXYzine (ATARAX/VISTARIL) tablet 25 mg  25 mg Oral TID PRN Mariel CraftMaurer, Sheila M, MD   25 mg at 03/03/18 2132  . LORazepam (ATIVAN) tablet 2 mg  2 mg Oral Q6H PRN Mariel CraftMaurer, Sheila M, MD   2 mg at 03/01/18 2118   Or  . LORazepam (ATIVAN) injection 2 mg  2 mg Intramuscular Q6H PRN Mariel CraftMaurer, Sheila M, MD      . LORazepam (ATIVAN) tablet 2 mg  2 mg Oral Q4H PRN Clapacs, Jackquline DenmarkJohn T, MD       Or  . LORazepam (ATIVAN) injection 2 mg  2 mg Intramuscular Q4H PRN Clapacs, John T, MD      . magnesium hydroxide (MILK OF MAGNESIA) suspension 30 mL  30 mL Oral Daily PRN Mariel CraftMaurer, Sheila M, MD   30 mL at 03/03/18 1317   . traZODone (DESYREL) tablet 100 mg  100 mg Oral QHS PRN Clapacs, Jackquline DenmarkJohn T, MD   100 mg at 03/03/18 2133    Lab Results: No results found for this or any previous visit (from the past 48 hour(s)).  Blood Alcohol level:  Lab Results  Component Value Date   ETH 153 (H) 02/27/2018    Metabolic Disorder Labs: No results found for: HGBA1C, MPG No results found for: PROLACTIN No results found for: CHOL, TRIG, HDL, CHOLHDL, VLDL, LDLCALC  Physical Findings: AIMS: Facial and Oral Movements Muscles of Facial Expression: None, normal Lips and Perioral Area: None, normal Jaw: None, normal Tongue: None, normal,Extremity Movements Upper (arms, wrists, hands, fingers): None, normal Lower (legs, knees, ankles, toes): None, normal, Trunk Movements Neck, shoulders, hips: None, normal, Overall Severity Severity of abnormal movements (highest score from questions above): None, normal Incapacitation due to abnormal movements: None, normal Patient's awareness of abnormal movements (rate only patient's report): No Awareness, Dental Status Current problems with teeth and/or dentures?: No Does patient usually wear dentures?: No  CIWA:  CIWA-Ar Total: 2 COWS:  COWS Total Score: 1  Musculoskeletal: Strength & Muscle Tone: within normal limits Gait & Station: normal Patient leans: N/A  Psychiatric Specialty Exam: Physical Exam  Nursing note and vitals reviewed. Constitutional: He appears well-developed and well-nourished.  Respiratory: Effort normal. No respiratory distress.  Neurological: He is alert.  Psychiatric: His affect is labile and inappropriate. His speech is tangential. He is agitated and aggressive. Thought content is paranoid and delusional. Cognition and memory are impaired. He expresses impulsivity and inappropriate judgment. He is noncommunicative.    Review of Systems  Constitutional: Negative.   HENT: Negative.   Eyes: Negative.   Respiratory: Negative.   Cardiovascular:  Negative.   Gastrointestinal: Negative.   Musculoskeletal: Negative.   Skin: Negative.   Neurological: Negative.   Psychiatric/Behavioral: Negative.     Blood pressure 112/79, pulse 94, temperature 99.7 F (37.6 C), temperature source Oral, resp. rate 16, height 6\' 1"  (1.854 m), weight 83.9 kg, SpO2 100 %.Body mass index is 24.41 kg/m.  General Appearance: Casual, isolative  Eye Contact:  Fair  Speech:  Garbled and Slurred  Volume:  Increased  Mood:  Angry and Irritable  Affect:  Congruent  Thought Process:  Disorganized  Orientation:  Negative  Thought Content:  Illogical, Delusions, Paranoid Ideation and Rumination, delusional  Suicidal Thoughts:  No  Homicidal Thoughts:  denies  Memory:  Immediate;   Fair Recent;   Poor Remote;   Poor  Judgement:  Impaired  Insight:  Lacking  Psychomotor Activity: normal  Concentration:  Concentration: Fair  Recall:  [employer] of Knowledge:  Fair  Language:  Fair  Akathisia:  No  Handed:  Right  AIMS (if indicated):     Assets:  Desire for Improvement Housing Physical Health Resilience  ADL's:  Intact  Cognition:  Impaired,  Mild  Sleep:  Number of Hours: 6.15     Treatment Plan Summary:  Pt with schizophrenia, isolative but mood and psychosis improving.  Plan-  Cont haldol. EKG pending Group/milieu tx as tolerated.  Cont tx plan  Beverly Sessions, MD 03/04/2018, 1:18 PM

## 2018-03-04 NOTE — Progress Notes (Signed)
D: Pt denies SI/HI/AVH, can contract for safety. Pt is pleasant and cooperative, engages appropriately, but still minimal overall. Pt. has no Complaints, just continues to be discharge fixated. Patient Interactions are appropriate. Pt. Observed walking around the milieu often, not interacting much. Pt. Reports normal mood. Pt. Affect still mostly flat.   A: Q x 15 minute observation checks were completed for safety. Patient was provided with education, but needs reinforcement. Patient was given/offered medications per orders. Patient  was encourage to attend groups, participate in unit activities and continue with plan of care. Pt. Chart and plans of care reviewed. Pt. Given support and encouragement.   R: Patient is complaint with medication and unit procedures. Pt. Attends snack time, eating good. Pt. Given sleep-aid requested. No behavioral concerns to report.             Precautionary checks every 15 minutes for safety maintained, room free of safety hazards, patient sustains no injury or falls during this shift. Will endorse care to next shift.

## 2018-03-04 NOTE — Plan of Care (Signed)
Patient verbalizes understanding of his prescribed therapeutic regimen and has been in compliance since this afternoon. Patient denies SI/HI/AVH as well as any depression and anxiety. Patient has been present in the milieu as well as outside in the courtyard with staff and other members on the unit without any issues. Patient has the ability to make decisions regarding his health-care and has been in compliance with his prescribed therapeutic regimen and has not voiced any further questions/concerns at this time. Patient has been free from injury thus far on the unit and remains safe at this time.  Problem: Education: Goal: Utilization of techniques to improve thought processes will improve Outcome: Progressing Goal: Knowledge of the prescribed therapeutic regimen will improve Outcome: Progressing   Problem: Coping: Goal: Coping ability will improve Outcome: Progressing Goal: Will verbalize feelings Outcome: Progressing   Problem: Activity: Goal: Will identify at least one activity in which they can participate Outcome: Progressing   Problem: Coping: Goal: Demonstration of participation in decision-making regarding own care will improve Outcome: Progressing   Problem: Education: Goal: Ability to make informed decisions regarding treatment will improve Outcome: Progressing   Problem: Medication: Goal: Compliance with prescribed medication regimen will improve Outcome: Progressing   Problem: Self-Concept: Goal: Ability to disclose and discuss suicidal ideas will improve Outcome: Progressing Goal: Will verbalize positive feelings about self Outcome: Progressing

## 2018-03-05 MED ORDER — HALOPERIDOL DECANOATE 100 MG/ML IM SOLN
200.0000 mg | Freq: Once | INTRAMUSCULAR | Status: AC
  Administered 2018-03-06: 200 mg via INTRAMUSCULAR
  Filled 2018-03-05: qty 2

## 2018-03-05 MED ORDER — HALOPERIDOL 5 MG PO TABS: 5.0000 mg | ORAL_TABLET | Freq: Four times a day (QID) | ORAL | Status: DC | PRN

## 2018-03-05 MED ORDER — INFLUENZA VAC SPLIT QUAD 0.5 ML IM SUSY: 0.5000 mL | PREFILLED_SYRINGE | INTRAMUSCULAR | Status: DC

## 2018-03-05 MED ORDER — DIPHENHYDRAMINE HCL 50 MG/ML IJ SOLN
50.0000 mg | Freq: Once | INTRAMUSCULAR | Status: AC
  Administered 2018-03-05: 50 mg via INTRAMUSCULAR

## 2018-03-05 MED ORDER — HALOPERIDOL LACTATE 5 MG/ML IJ SOLN
10.0000 mg | Freq: Four times a day (QID) | INTRAMUSCULAR | Status: DC | PRN
  Administered 2018-03-05: 10 mg via INTRAMUSCULAR

## 2018-03-05 MED ORDER — DIPHENHYDRAMINE HCL 50 MG/ML IJ SOLN
INTRAMUSCULAR | Status: AC
  Administered 2018-03-05: 50 mg via INTRAMUSCULAR
  Filled 2018-03-05: qty 1

## 2018-03-05 NOTE — [facility] Group Notes (Signed)
LCSW Group Therapy Note   03/05/2018 1:00 PM  Type of Therapy and Topic:  Group Therapy:  Overcoming Obstacles   Participation Level:  None   Description of Group:    In this group patients will be encouraged to explore what they see as obstacles to their own wellness and recovery. They will be guided to discuss their thoughts, feelings, and behaviors related to these obstacles. The group will process together ways to cope with barriers, with attention given to specific choices patients can make. Each patient will be challenged to identify changes they are motivated to make in order to overcome their obstacles. This group will be process-oriented, with patients participating in exploration of their own experiences as well as giving and receiving support and challenge from other group members.   Therapeutic Goals: 1. Patient will identify personal and current obstacles as they relate to admission. 2. Patient will identify barriers that currently interfere with their wellness or overcoming obstacles.  3. Patient will identify feelings, thought process and behaviors related to these barriers. 4. Patient will identify two changes they are willing to make to overcome these obstacles:      Summary of Patient Progress Patient attended group, however did not participate.  Patient arrived to group late and only remained for a few minutes before he left.   Therapeutic Modalities:   Cognitive Behavioral Therapy Solution Focused Therapy Motivational Interviewing Relapse Prevention Therapy  Penni Homans, MSW, LCSW 03/05/2018 3:56 PM

## 2018-03-05 NOTE — Progress Notes (Signed)
Advised Dr. Toni Amend that 10 mg haldol held at 1700 due to sedation.

## 2018-03-05 NOTE — Progress Notes (Signed)
Recreation Therapy Notes  Date: 03/05/2018  Time: 9:30 am  Location: [facility]  Behavioral response: Appropriate  Intervention Topic: Emotions  Discussion/Intervention:  Group content on today was focused on emotions. The group identified what emotions are and why it is important to have emotions. Patients expressed some positive and negative emotions. Individuals gave some past experiences on how they normally dealt with emotions in the past. The group described some positive ways to deal with emotions in the future. Patients participated in the intervention "Name the [name]" where individuals were given a chance to experience different emotions.  Clinical Observations/Feedback:  Patient came to group late due to unknown reasons. Individual was social with peers and staff while participating in the intervention. Banjamin Stovall LRT/CTRS         Makyiah Lie 03/05/2018 10:56 AM

## 2018-03-05 NOTE — Progress Notes (Signed)
   03/05/18 0100  Clinical Encounter Type  Visited With Patient  Visit Type Initial;Follow-up;Spiritual support  Referral From Nurse  Spiritual Encounters  Spiritual Needs Emotional;Other (Comment)

## 2018-03-05 NOTE — Plan of Care (Signed)
D: Patient presents with flat, blunted affect.  He denies any thoughts of self harm today.  He can be isolative to room, however, he has been observed in the milieu.  Patient is compliant with his medications.  Patient is no physical distress; his mood appears stable.  He is quiet; forwards little.  He has minimal interaction with  his peers.  Patient is a possible discharge today per MD.  A: Continue to monitor medication management and MD orders.  Safety checks completed every 15 minutes per protocol.  Offer support and encouragement as needed.  R: Patient is receptive to staff; his behavior is appropriate.     Problem: Education: Goal: Utilization of techniques to improve thought processes will improve Outcome: Adequate for Discharge Goal: Knowledge of the prescribed therapeutic regimen will improve Outcome: Adequate for Discharge   Problem: Coping: Goal: Coping ability will improve Outcome: Adequate for Discharge Goal: Will verbalize feelings Outcome: Adequate for Discharge   Problem: Activity: Goal: Will identify at least one activity in which they can participate Outcome: Adequate for Discharge   Problem: Coping: Goal: Demonstration of participation in decision-making regarding own care will improve Outcome: Adequate for Discharge   Problem: Education: Goal: Ability to make informed decisions regarding treatment will improve Outcome: Adequate for Discharge   Problem: Medication: Goal: Compliance with prescribed medication regimen will improve Outcome: Adequate for Discharge   Problem: Self-Concept: Goal: Ability to disclose and discuss suicidal ideas will improve Outcome: Adequate for Discharge Goal: Will verbalize positive feelings about self Outcome: Adequate for Discharge

## 2018-03-05 NOTE — [facility] Group Notes (Signed)
[facility] Group Notes:  (Nursing/MHT/Case Management/Adjunct)  Date:  03/05/2018  Time:  10:06 PM  Type of Therapy:  Group Therapy  Participation Level:  Did Not Attend   Mayra Neer 03/05/2018, 10:06 PM

## 2018-03-05 NOTE — Progress Notes (Signed)
Patient in the hallway and is clearly upset.  He is cursing and yelling.  Patient is marking verbal threats against MD.  He states, "I gonna get up [place] of this MF place or something's gonna happen.  How come that MF won't let me go today? What's the difference between today and tomorrow?"  Attempted to deescalate patient verbally, however, he become more agitated.  He was not aggressive toward staff, however, kept making threats that he was "gonna do something."  Patient was visible from nurse's station; he had security and MHT with him.  Informed MD of patient's escalating behavior and received order for IM injections.  Patient was not receptive to injections, refusing them at first.  Law enforcement came onto the unit and spoke with patient about cooperating with staff and taking the injection.  Patient allowed me to give injections in right deltoid.  Patient cooperated and staff did not have to lay hands on patient.  Explained to patient that his threatening behavior was upsetting his peers, and the injections were for safety of the milieu.  Patient tolerated injections well.  He is currently asleep in his bed.

## 2018-03-05 NOTE — Progress Notes (Signed)
[facility]  03/05/2018 7:16 PM Larry Walter  MRN:  [id number] Subjective:  Continues to be paranoid with poor insight. Easily angered Required forced medication in the afternoon after beconing angry. No change to medical needs Principal Problem: Schizophrenia (HCC) Diagnosis: Principal Problem:   Schizophrenia (HCC) Active Problems:   Marijuana abuse, continuous   Alcohol use disorder, severe, dependence (HCC)  Total Time spent with patient: 30 minutes  Past Psychiatric History: Schizophrenia  Past Medical History:  Past Medical History:  Diagnosis Date  . Anxiety   . Bipolar disorder (HCC)   . Depression   . Heart murmur   . Hypertension    History reviewed. No pertinent surgical history. Family History: History reviewed. No pertinent family history. Family Psychiatric  History: one Social History:  Social History   Substance and Sexual Activity  Alcohol Use Not on file     Social History   Substance and Sexual Activity  Drug Use Not on file    Social History   Socioeconomic History  . Marital status: Single    Spouse name: Not on file  . Number of children: Not on file  . Years of education: Not on file  . Highest education level: Not on file  Occupational History  . Not on file  Social Needs  . Financial resource strain: Not on file  . Food insecurity:    Worry: Not on file    Inability: Not on file  . Transportation needs:    Medical: Not on file    Non-medical: Not on file  Tobacco Use  . Smoking status: Current Every Day Smoker  . Smokeless tobacco: Former Engineer, waterUser  Substance and Sexual Activity  . Alcohol use: Not on file  . Drug use: Not on file  . Sexual activity: Not on file  Lifestyle  . Physical activity:    Days per week: Not on file    Minutes per session: Not on file  . Stress: Not on file  Relationships  . Social connections:    Talks on phone: Not on file    Gets together: Not on file    Attends religious service:  Not on file    Active member of club or organization: Not on file    Attends meetings of clubs or organizations: Not on file    Relationship status: Not on file  Other Topics Concern  . Not on file  Social History Narrative  . Not on file   Additional Social History:                         Sleep: Fair  Appetite:  Fair  Current Medications: Current Facility-Administered Medications  Medication Dose Route Frequency Provider Last Rate Last Dose  . acetaminophen (TYLENOL) tablet 650 mg  650 mg Oral Q6H PRN Mariel CraftMaurer, Sheila M, MD   650 mg at 03/02/18 1038  . alum & mag hydroxide-simeth (MAALOX/MYLANTA) 200-200-20 MG/5ML suspension 30 mL  30 mL Oral Q4H PRN Mariel CraftMaurer, Sheila M, MD      . haloperidol (HALDOL) tablet 10 mg  10 mg Oral TID Clapacs, Jackquline DenmarkJohn T, MD   Stopped at 03/05/18 1637  . haloperidol lactate (HALDOL) injection 10 mg  10 mg Intramuscular Q6H PRN Clapacs, John T, MD   10 mg at 03/05/18 1440   Or  . haloperidol (HALDOL) tablet 5 mg  5 mg Oral Q6H PRN Clapacs, Jackquline DenmarkJohn T, MD      .  haloperidol lactate (HALDOL) injection 5 mg  5 mg Intramuscular TID PRN Clapacs, John T, MD      . hydrOXYzine (ATARAX/VISTARIL) tablet 25 mg  25 mg Oral TID PRN Mariel CraftMaurer, Sheila M, MD   25 mg at 03/03/18 2132  . [START ON 03/06/2018] Influenza vac split quadrivalent PF (FLUARIX) injection 0.5 mL  0.5 mL Intramuscular Tomorrow-1000 Clapacs, John T, MD      . LORazepam (ATIVAN) tablet 2 mg  2 mg Oral Q6H PRN Mariel CraftMaurer, Sheila M, MD   2 mg at 03/01/18 2118   Or  . LORazepam (ATIVAN) injection 2 mg  2 mg Intramuscular Q6H PRN Mariel CraftMaurer, Sheila M, MD      . LORazepam (ATIVAN) tablet 2 mg  2 mg Oral Q4H PRN Clapacs, Jackquline DenmarkJohn T, MD       Or  . LORazepam (ATIVAN) injection 2 mg  2 mg Intramuscular Q4H PRN Clapacs, Jackquline DenmarkJohn T, MD   2 mg at 03/05/18 1441  . magnesium hydroxide (MILK OF MAGNESIA) suspension 30 mL  30 mL Oral Daily PRN Mariel CraftMaurer, Sheila M, MD   30 mL at 03/03/18 1317  . traZODone (DESYREL) tablet 100 mg  100 mg  Oral QHS PRN Clapacs, Jackquline DenmarkJohn T, MD   100 mg at 03/04/18 2053    Lab Results: No results found for this or any previous visit (from the past 48 hour(s)).  Blood Alcohol level:  Lab Results  Component Value Date   ETH 153 (H) 02/27/2018    Metabolic Disorder Labs: No results found for: HGBA1C, MPG No results found for: PROLACTIN No results found for: CHOL, TRIG, HDL, CHOLHDL, VLDL, LDLCALC  Physical Findings: AIMS: Facial and Oral Movements Muscles of Facial Expression: None, normal Lips and Perioral Area: None, normal Jaw: None, normal Tongue: None, normal,Extremity Movements Upper (arms, wrists, hands, fingers): None, normal Lower (legs, knees, ankles, toes): None, normal, Trunk Movements Neck, shoulders, hips: None, normal, Walter Severity Severity of abnormal movements (highest score from questions above): None, normal Incapacitation due to abnormal movements: None, normal Patient's awareness of abnormal movements (rate only patient's report): No Awareness, Dental Status Current problems with teeth and/or dentures?: No Does patient usually wear dentures?: No  CIWA:  CIWA-Ar Total: 2 COWS:  COWS Total Score: 1  Musculoskeletal: Strength & Muscle Tone: within normal limits Gait & Station: normal Patient leans: N/A  Psychiatric Specialty Exam: Physical Exam  Nursing note and vitals reviewed. Constitutional: He appears well-developed and well-nourished.  HENT:  Head: Normocephalic and atraumatic.  Eyes: Pupils are equal, round, and reactive to light. Conjunctivae are normal.  Neck: Normal range of motion.  Cardiovascular: Regular rhythm and normal heart sounds.  Respiratory: Effort normal.  GI: Soft.  Musculoskeletal: Normal range of motion.  Neurological: He is alert.  Skin: Skin is warm and dry.  Psychiatric: His affect is labile. His speech is tangential. He is agitated. Thought content is paranoid and delusional. Cognition and memory are impaired. He expresses  impulsivity.    Review of Systems  Constitutional: Negative.   HENT: Negative.   Eyes: Negative.   Respiratory: Negative.   Cardiovascular: Negative.   Gastrointestinal: Negative.   Musculoskeletal: Negative.   Skin: Negative.   Neurological: Negative.   Psychiatric/Behavioral: Negative.     Blood pressure 118/79, pulse 94, temperature 98.1 F (36.7 C), temperature source Oral, resp. rate 16, height 6\' 1"  (1.854 m), weight 83.9 kg, SpO2 100 %.Body mass index is 24.41 kg/m.  General Appearance: Casual  Eye Contact:  Fair  Speech:  Clear and Coherent  Volume:  Normal  Mood:  Angry and Irritable  Affect:  Inappropriate and Labile  Thought Process:  Disorganized  Orientation:  Full (Time, Place, and Person)  Thought Content:  Illogical, Delusions and Paranoid Ideation  Suicidal Thoughts:  No  Homicidal Thoughts:  Yes.  without intent/plan  Memory:  Immediate;   Fair Recent;   Fair Remote;   Fair  Judgement:  Poor  Insight:  Lacking  Psychomotor Activity:  Increased  Concentration:  Concentration: Poor  Recall:  Poor  Fund of Knowledge:  Poor  Language:  Poor  Akathisia:  Negative  Handed:  Right  AIMS (if indicated):     Assets:  Housing Physical Health Resilience  ADL's:  Impaired  Cognition:  Impaired,  Mild  Sleep:  Number of Hours: 6.15     Treatment Plan Summary: Daily contact with patient to assess and evaluate symptoms and progress in treatment, Medication management and Plan Increase haldol with haldol shot today if possible. Blood pressure stable. PLan for possible discharge 1-2 days  Mordecai Rasmussen, MD 03/05/2018, 7:16 PM

## 2018-03-06 MED ORDER — HALOPERIDOL DECANOATE 100 MG/ML IM SOLN: 200.0000 mg | INTRAMUSCULAR | 1 refills | Status: DC

## 2018-03-06 MED ORDER — TRAZODONE HCL 100 MG PO TABS: 100.0000 mg | ORAL_TABLET | Freq: Every evening | ORAL | 0 refills | Status: DC | PRN

## 2018-03-06 MED ORDER — HALOPERIDOL 10 MG PO TABS: 10.0000 mg | ORAL_TABLET | Freq: Two times a day (BID) | ORAL | 0 refills | Status: DC

## 2018-03-06 NOTE — Progress Notes (Signed)
D: Pt denies SI/HI/AVH, can contract for safety. Pt. Is observed in his room for assessments. Pt. Reports just wanting to sleep. Pt. Mood appears dysphoric. Pt. Contact is minimal. Pt. Forwards little. Pt. Discharge fixated.  A: Q x 15 minute observation checks were completed for safety. Patient was attempted to be provided with education, but refused. Patient was given/offered medications per orders. Patient  was encourage to attend groups, participate in unit activities and continue with plan of care. Pt. Chart and plans of care reviewed. Pt. Given support and encouragement.   R: Patient is isolative and withdrawn to his room the entire shift. Pt. Does not attend snack time and or group.           Precautionary checks every 15 minutes for safety maintained, room free of safety hazards, patient sustains no injury or falls during this shift. Will endorse care to next shift.

## 2018-03-06 NOTE — Discharge Summary (Signed)
Physician Discharge Summary Note  Patient:  Larry Walter is an 43 y.o., male MRN:  [id number] DOB:  04-17-1975 Patient phone:  [phone] (home)  Patient address:   [address] [place] [address],  Total Time spent with patient: 45 minutes  Date of Admission:  02/28/2018 Date of Discharge: March 06, 2018  Reason for Admission: Patient admitted through the emergency room after petition for agitated paranoid threatening behavior  Principal Problem: Schizophrenia [facility]) Discharge Diagnoses: Principal Problem:   Schizophrenia ([place]) Active Problems:   Marijuana abuse, continuous   Alcohol use disorder, severe, dependence ([place])   Past Psychiatric History: Patient has a history of schizophrenia along stable on IM Haldol Decanoate  Past Medical History:  Past Medical History:  Diagnosis Date  . Anxiety   . Bipolar disorder ([place])   . Depression   . Heart murmur   . Hypertension    History reviewed. No pertinent surgical history. Family History: History reviewed. No pertinent family history. Family Psychiatric  History: None known Social History:  Social History   Substance and Sexual Activity  Alcohol Use Not on file     Social History   Substance and Sexual Activity  Drug Use Not on file    Social History   Socioeconomic History  . Marital status: Single    Spouse name: Not on file  . Number of children: Not on file  . Years of education: Not on file  . Highest education level: Not on file  Occupational History  . Not on file  Social Needs  . Financial resource strain: Not on file  . Food insecurity:    Worry: Not on file    Inability: Not on file  . Transportation needs:    Medical: Not on file    Non-medical: Not on file  Tobacco Use  . Smoking status: Current Every Day Smoker  . Smokeless tobacco: Former Network engineer and Sexual Activity  . Alcohol use: Not on file  . Drug use: Not on file  . Sexual activity: Not on file  Lifestyle   . Physical activity:    Days per week: Not on file    Minutes per session: Not on file  . Stress: Not on file  Relationships  . Social connections:    Talks on phone: Not on file    Gets together: Not on file    Attends religious service: Not on file    Active member of club or organization: Not on file    Attends meetings of clubs or organizations: Not on file    Relationship status: Not on file  Other Topics Concern  . Not on file  Social History Narrative  . Not on file    Hospital Course: Patient admitted to the hospital because of increased agitation and threatening behavior outside the hospital.  Met with treatment team.  Treatment team communicated with act team.  Communicated with patient's mother.  Established that patient had been more paranoid and agitated recently.  Also had been using more marijuana recently.  Patient continued on Haldol and dose was increased.  Received Haldol decanoate 200 mg IM which was an increased dose prior to discharge.  Just charged on that as well as some oral Haldol.  Patient had some periods of agitation but was not violent or threatening at discharge.  Had one episode of getting into a fight with another patient although the blame seems to have been mutual.  At this point denies suicidal or  homicidal ideation.  Appears to be able to take care of ADLs.  Has been cooperative with counseling about the negative impact of substance abuse.  Agrees to continue to follow-up with his act team.  Physical Findings: AIMS: Facial and Oral Movements Muscles of Facial Expression: None, normal Lips and Perioral Area: None, normal Jaw: None, normal Tongue: None, normal,Extremity Movements Upper (arms, wrists, hands, fingers): None, normal Lower (legs, knees, ankles, toes): None, normal, Trunk Movements Neck, shoulders, hips: None, normal, Overall Severity Severity of abnormal movements (highest score from questions above): None, normal Incapacitation due to  abnormal movements: None, normal Patient's awareness of abnormal movements (rate only patient's report): No Awareness, Dental Status Current problems with teeth and/or dentures?: No Does patient usually wear dentures?: No  CIWA:  CIWA-Ar Total: 2 COWS:  COWS Total Score: 1  Musculoskeletal: Strength & Muscle Tone: within normal limits Gait & Station: normal Patient leans: N/A  Psychiatric Specialty Exam: Physical Exam  Nursing note and vitals reviewed. Constitutional: He appears well-developed and well-nourished.  HENT:  Head: Normocephalic and atraumatic.  Eyes: Pupils are equal, round, and reactive to light. Conjunctivae are normal.  Neck: Normal range of motion.  Cardiovascular: Regular rhythm and normal heart sounds.  Respiratory: Effort normal. No respiratory distress.  GI: Soft.  Musculoskeletal: Normal range of motion.  Neurological: He is alert.  Skin: Skin is warm and dry.  Psychiatric: He has a normal mood and affect. His speech is normal and behavior is normal. Judgment and thought content normal. Cognition and memory are normal.    Review of Systems  Constitutional: Negative.   HENT: Negative.   Eyes: Negative.   Respiratory: Negative.   Cardiovascular: Negative.   Gastrointestinal: Negative.   Musculoskeletal: Negative.   Skin: Negative.   Neurological: Negative.   Psychiatric/Behavioral: Negative.     Blood pressure 111/82, pulse 87, temperature 97.6 F (36.4 C), temperature source Oral, resp. rate 18, height '6\' 1"'  (1.854 m), weight 83.9 kg, SpO2 100 %.Body mass index is 24.41 kg/m.  General Appearance: Fairly Groomed  Eye Contact:  Good  Speech:  Clear and Coherent  Volume:  Normal  Mood:  Euthymic  Affect:  Constricted  Thought Process:  Goal Directed  Orientation:  Full (Time, Place, and Person)  Thought Content:  Logical  Suicidal Thoughts:  No  Homicidal Thoughts:  No  Memory:  Immediate;   Fair Recent;   Fair Remote;   Fair  Judgement:   Fair  Insight:  Fair  Psychomotor Activity:  Decreased  Concentration:  Concentration: Fair  Recall:  [employer] of Knowledge:  Fair  Language:  Fair  Akathisia:  No  Handed:  Right  AIMS (if indicated):     Assets:  Desire for Improvement Housing Physical Health Resilience Social Support  ADL's:  Intact  Cognition:  WNL  Sleep:  Number of Hours: 7.15     Have you used any form of tobacco in the last 30 days? (Cigarettes, Smokeless Tobacco, Cigars, and/or Pipes): Yes  Has this patient used any form of tobacco in the last 30 days? (Cigarettes, Smokeless Tobacco, Cigars, and/or Pipes) Yes, No  Blood Alcohol level:  Lab Results  Component Value Date   ETH 153 (H) 31/51/7616    Metabolic Disorder Labs:  No results found for: HGBA1C, MPG No results found for: PROLACTIN No results found for: CHOL, TRIG, HDL, CHOLHDL, VLDL, LDLCALC  See Psychiatric Specialty Exam and Suicide Risk Assessment completed by Attending Physician prior to discharge.  Discharge destination:  Home  Is patient on multiple antipsychotic therapies at discharge:  No   Has Patient had three or more failed trials of antipsychotic monotherapy by history:  No  Recommended Plan for Multiple Antipsychotic Therapies: NA  Discharge Instructions    Diet - low sodium heart healthy   Complete by:  As directed    Increase activity slowly   Complete by:  As directed      Allergies as of 03/06/2018   Not on File     Medication List    TAKE these medications     Indication  haloperidol 10 MG tablet Commonly known as:  HALDOL Take 1 tablet (10 mg total) by mouth 2 (two) times daily.  Indication:  Schizophrenia   haloperidol decanoate 100 MG/ML injection Commonly known as:  HALDOL DECANOATE Inject 2 mLs (200 mg total) into the muscle every 28 (twenty-eight) days. Start taking on:  April 03, 2018  Indication:  Schizophrenia   traZODone 100 MG tablet Commonly known as:  DESYREL Take 1 tablet (100  mg total) by mouth at bedtime as needed for sleep.  Indication:  [place] Follow up.   Why:  Follow up with your ACT team on 03/06/2018. Thank you. Contact information: [address] [place] [address] [phone]           Follow-up recommendations:  Activity:  Activity as tolerated Diet:  Regular diet Other:  Follow-up with acting  Comments: Follow-up with act team in the community continue Haldol decanoate as primary medicine.  Strongly encouraged discontinuation of marijuana use  Signed: Alethia Berthold, MD 03/06/2018, 9:46 AM

## 2018-03-06 NOTE — [facility] Suicide Risk Assessment (Signed)
[facility] INPATIENT:  Family/Significant Other Suicide Prevention Education  Suicide Prevention Education:  Education Completed; Rosanna Randy [phone], ACTT team member has been identified by the patient as the family member/significant other with whom the patient will be residing, and identified as the person(s) who will aid the patient in the event of a mental health crisis (suicidal ideations/suicide attempt).  With written consent from the patient, the family member/significant other has been provided the following suicide prevention education, prior to the and/or following the discharge of the patient.  The suicide prevention education provided includes the following:  Suicide risk factors  Suicide prevention and interventions  National Suicide Hotline telephone number  [facility] assessment telephone number  [facility] Emergency Assistance 911  [facility] and/or Residential Mobile Crisis Unit telephone number  Request made of family/significant other to:  Remove weapons (e.g., guns, rifles, knives), all items previously/currently identified as safety concern.    Remove drugs/medications (over-the-counter, prescriptions, illicit drugs), all items previously/currently identified as a safety concern.  The family member/significant other verbalizes understanding of the suicide prevention education information provided.  The family member/significant other agrees to remove the items of safety concern listed above. Ms. Rod Can reports pt lives with his parents and no access to guns or weapons in the home. Prior to this hospitalization, she reports the pt was stable on his medication for several years with no psychosis. She also reports the pt engages with the team, takes medication as prescribed, attends appointments and has a goal of getting his own apartment. CSW informed her if pt needs further medical attention to call 911 or bring pt to the nearest emergency  department.   Larry Walter 03/06/2018, 9:50 AM

## 2018-03-06 NOTE — Progress Notes (Signed)
Recreation Therapy Notes   Date: 03/06/2018  Time: 9:30 am  Location: Craft Room  Behavioral response: Appropriate  Intervention Topic: Goals  Discussion/Intervention:  Group content on today was focused on goals. Patients described what goals are and how they define goals. Individuals expressed how they go about setting goals and reaching them. The group identified how important goals are and if they make short term goals to reach long term goals. Patients described how many goals they work on at a time and what affects them not reaching their goal. Individuals described how much time they put into planning and obtaining their goals. The group participated in the intervention "My Goal Board" and made personal goal boards to help them achieve their goal. Clinical Observations/Feedback:  Patient came to group late due to unknown reasons. He stated that he make goals to stay determined. Individual was social with peers and staff while participating in the intervention. Meher Kucinski LRT/CTRS         Neeko Pharo 03/06/2018 11:53 AM

## 2018-03-06 NOTE — Progress Notes (Signed)
  [facility] Adult Case Management Discharge Plan :  Will you be returning to the same living situation after discharge:  Yes,  lives with parents At discharge, do you have transportation home?: Yes,  CSW will provide taxi voucher Do you have the ability to pay for your medications: Yes,  insurance  Release of information consent forms completed and in the chart;  Patient's signature needed at discharge.  Patient to Follow up at: Follow-up Information    [facility] Follow up.   Why:  Follow up with your ACT team on 03/06/2018. Thank you. Contact information: [address] [place] [address] [phone]           Next level of care provider has access to [facility] Link:no  Safety Planning and Suicide Prevention discussed: Yes,  Rosanna Randy, ACTT member  Have you used any form of tobacco in the last 30 days? (Cigarettes, Smokeless Tobacco, Cigars, and/or Pipes): Yes  Has patient been referred to the Quitline?: Physician counseled pt on smoking cessation.  Patient has been referred for addiction treatment: N/A  Suzan Slick, LCSW 03/06/2018, 9:57 AM

## 2018-03-06 NOTE — Plan of Care (Signed)
Pt. Able to remain safe while on the unit. Pt. Denies si/hi/avh, can contract for safety.    Problem: Self-Concept: Goal: Ability to disclose and discuss suicidal ideas will improve Outcome: Progressing

## 2018-03-06 NOTE — [facility] Suicide Risk Assessment (Signed)
[facility]   Principal Problem: Schizophrenia [facility](HCC) Discharge Diagnoses: Principal Problem:   Schizophrenia ([facility]) Active Problems:   Marijuana abuse, continuous   Alcohol use disorder, severe, dependence ([facility])   Total Time spent with patient: 45 minutes  Musculoskeletal: Strength & Muscle Tone: within normal limits Gait & Station: normal Patient leans: N/A  Psychiatric Specialty Exam: Review of Systems  Constitutional: Negative.   HENT: Negative.   Eyes: Negative.   Respiratory: Negative.   Cardiovascular: Negative.   Gastrointestinal: Negative.   Musculoskeletal: Negative.   Skin: Negative.   Neurological: Negative.   Psychiatric/Behavioral: Negative.     Blood pressure 111/82, pulse 87, temperature 97.6 F (36.4 C), temperature source Oral, resp. rate 18, height 6\' 1"  (1.854 m), weight 83.9 kg, SpO2 100 %.Body mass index is 24.41 kg/m.  General Appearance: Fairly Groomed  Patent attorneyye Contact::  Good  Speech:  Clear and Coherent409  Volume:  Normal  Mood:  Euthymic  Affect:  Congruent  Thought Process:  Coherent  Orientation:  Full (Time, Place, and Person)  Thought Content:  Logical  Suicidal Thoughts:  No  Homicidal Thoughts:  No  Memory:  Immediate;   Fair Recent;   Fair Remote;   Fair  Judgement:  Fair  Insight:  Fair  Psychomotor Activity:  Normal  Concentration:  Fair  Recall:  [employer]  Fund of Knowledge:Fair  Language: Fair  Akathisia:  No  Handed:  Right  AIMS (if indicated):     Assets:  Communication Skills Desire for Improvement Housing Physical Health Resilience Social Support  Sleep:  Number of Hours: 7.15  Cognition: WNL  ADL's:  Intact   Mental Status Per Nursing Assessment::   On Admission:  NA  Demographic Factors:  Male and Living alone  Loss Factors: Financial problems/change in socioeconomic status  Historical Factors: Impulsivity  Risk Reduction Factors:   Positive social support and Positive  therapeutic relationship  Continued Clinical Symptoms:  Schizophrenia:   Paranoid or undifferentiated type  Cognitive Features That Contribute To Risk:  Closed-mindedness    Suicide Risk:  Minimal: No identifiable suicidal ideation.  Patients presenting with no risk factors but with morbid ruminations; may be classified as minimal risk based on the severity of the depressive symptoms  Follow-up Information    [facility] Follow up.   Why:  Follow up with your ACT team. Contact information: [address] [place] [address] [phone]           Plan Of Care/Follow-up recommendations:  Activity:  Activity as tolerated Diet:  Regular diet, try to minimize salt Other:  Follow-up with local mental health providers to continue with your act team  Mordecai RasmussenJohn Clapacs, MD 03/06/2018, 9:42 AM

## 2018-03-06 NOTE — Progress Notes (Signed)
Patient agitated this morning not wanting to take ordered Haldol injection. Will notify treatment team during hand-off report of patient non-compliance.

## 2018-03-06 NOTE — Progress Notes (Signed)
Recreation Therapy Notes  INPATIENT RECREATION TR PLAN  Patient Details Name: Larry Walter MRN: [id number] DOB: Jul 14, 1975 Today's Date: 03/06/2018  Rec Therapy Plan Is patient appropriate for Therapeutic Recreation?: Yes Treatment times per week: at least 3 Estimated Length of Stay: 5-7 days TR Treatment/Interventions: Group participation (Comment)  Discharge Criteria Pt will be discharged from therapy if:: Discharged Treatment plan/goals/alternatives discussed and agreed upon by:: Patient/family  Discharge Summary Short term goals set: Patient will identify 3 healthy leisure activities that can be utilized post d/c within 5 recreation therapy group sessions Short term goals met: Adequate for discharge Progress toward goals comments: Groups attended Which groups?: Goal setting, Other (Comment)(Emotions) Reason goals not met: N/A Therapeutic equipment acquired: N/A Reason patient discharged from therapy: Discharge from hospital Pt/family agrees with progress & goals achieved: Yes Date patient discharged from therapy: 03/06/18   [name] 03/06/2018, 11:55 AM

## 2018-04-26 ENCOUNTER — Encounter: Payer: Self-pay | Admitting: Emergency Medicine

## 2018-04-26 ENCOUNTER — Emergency Department: Payer: Medicare Other

## 2018-04-26 ENCOUNTER — Emergency Department
Admission: EM | Admit: 2018-04-26 | Discharge: 2018-04-27 | Disposition: A | Discharge status: Other Acute Inpatient Hospital | Payer: Medicare Other | Attending: Emergency Medicine | Admitting: Emergency Medicine

## 2018-04-26 ENCOUNTER — Other Ambulatory Visit: Payer: Self-pay

## 2018-04-26 DIAGNOSIS — Z79899 Other long term (current) drug therapy: Secondary | ICD-10-CM | POA: Diagnosis not present

## 2018-04-26 DIAGNOSIS — F1020 Alcohol dependence, uncomplicated: Secondary | ICD-10-CM | POA: Diagnosis not present

## 2018-04-26 DIAGNOSIS — F29 Unspecified psychosis not due to a substance or known physiological condition: Secondary | ICD-10-CM

## 2018-04-26 DIAGNOSIS — I10 Essential (primary) hypertension: Secondary | ICD-10-CM | POA: Diagnosis not present

## 2018-04-26 DIAGNOSIS — F1210 Cannabis abuse, uncomplicated: Secondary | ICD-10-CM | POA: Diagnosis not present

## 2018-04-26 DIAGNOSIS — F209 Schizophrenia, unspecified: Secondary | ICD-10-CM | POA: Diagnosis present

## 2018-04-26 DIAGNOSIS — Z87891 Personal history of nicotine dependence: Secondary | ICD-10-CM | POA: Diagnosis not present

## 2018-04-26 LAB — COMPREHENSIVE METABOLIC PANEL
ALK PHOS: 73 U/L (ref 38–126)
ALT: 14 U/L (ref 0–44)
AST: 19 U/L (ref 15–41)
Albumin: 4.3 g/dL (ref 3.5–5.0)
Anion gap: 11 (ref 5–15)
BUN: 8 mg/dL (ref 6–20)
CO2: 22 mmol/L (ref 22–32)
Calcium: 9.2 mg/dL (ref 8.9–10.3)
Chloride: 108 mmol/L (ref 98–111)
Creatinine, Ser: 0.69 mg/dL (ref 0.61–1.24)
GFR calc Af Amer: >60 mL/min (ref >60)
Glucose, Bld: 78 mg/dL (ref 70–99)
Potassium: 3.6 mmol/L (ref 3.5–5.1)
Sodium: 141 mmol/L (ref 135–145)
Total Bilirubin: 0.5 mg/dL (ref 0.3–1.2)
Total Protein: 8.9 g/dL — ABNORMAL HIGH (ref 6.5–8.1)

## 2018-04-26 LAB — CBC
HCT: 46.7 % (ref 39.0–52.0)
Hemoglobin: 15.5 g/dL (ref 13.0–17.0)
MCH: 30 pg (ref 26.0–34.0)
MCHC: 33.2 g/dL (ref 30.0–36.0)
MCV: 90.5 fL (ref 80.0–100.0)
Platelets: 325 10*3/uL (ref 150–400)
RBC: 5.16 MIL/uL (ref 4.22–5.81)
RDW: 16.5 % — AB (ref 11.5–15.5)
WBC: 12.3 10*3/uL — ABNORMAL HIGH (ref 4.0–10.5)
nRBC: 0 % (ref 0.0–0.2)

## 2018-04-26 LAB — URINE DRUG SCREEN, QUALITATIVE (ARMC ONLY)
Amphetamines, Ur Screen: NOT DETECTED
Barbiturates, Ur Screen: NOT DETECTED
Benzodiazepine, Ur Scrn: NOT DETECTED
CANNABINOID 50 NG, UR ~~LOC~~: POSITIVE — AB
Cocaine Metabolite,Ur ~~LOC~~: NOT DETECTED
MDMA (Ecstasy)Ur Screen: NOT DETECTED
Methadone Scn, Ur: NOT DETECTED
Opiate, Ur Screen: NOT DETECTED
Phencyclidine (PCP) Ur S: NOT DETECTED
Tricyclic, Ur Screen: NOT DETECTED

## 2018-04-26 LAB — CHLAMYDIA/NGC RT PCR (ARMC ONLY)
CHLAMYDIA TR: NOT DETECTED
N gonorrhoeae: NOT DETECTED

## 2018-04-26 LAB — ETHANOL: ALCOHOL ETHYL (B): 109 mg/dL — AB (ref <10)

## 2018-04-26 MED ORDER — HALOPERIDOL 5 MG PO TABS: 5.0000 mg | ORAL_TABLET | Freq: Four times a day (QID) | ORAL | Status: DC | PRN

## 2018-04-26 MED ORDER — LORAZEPAM 2 MG/ML IJ SOLN
2.0000 mg | Freq: Four times a day (QID) | INTRAMUSCULAR | Status: DC | PRN
  Filled 2018-04-26: qty 1

## 2018-04-26 MED ORDER — LORAZEPAM 2 MG PO TABS: 2.0000 mg | ORAL_TABLET | Freq: Four times a day (QID) | ORAL | Status: DC | PRN

## 2018-04-26 MED ORDER — OLANZAPINE 10 MG PO TBDP
10.0000 mg | ORAL_TABLET | Freq: Three times a day (TID) | ORAL | Status: DC | PRN
  Filled 2018-04-26: qty 1

## 2018-04-26 MED ORDER — HALOPERIDOL LACTATE 5 MG/ML IJ SOLN
5.0000 mg | Freq: Four times a day (QID) | INTRAMUSCULAR | Status: DC | PRN
  Filled 2018-04-26: qty 1

## 2018-04-26 MED ORDER — DIPHENHYDRAMINE HCL 50 MG/ML IJ SOLN
25.0000 mg | Freq: Four times a day (QID) | INTRAMUSCULAR | Status: DC | PRN
  Filled 2018-04-26: qty 1

## 2018-04-26 MED ORDER — BENZTROPINE MESYLATE 1 MG PO TABS: 1.0000 mg | ORAL_TABLET | Freq: Four times a day (QID) | ORAL | Status: DC | PRN

## 2018-04-26 MED ORDER — LORAZEPAM 1 MG PO TABS: 1.0000 mg | ORAL_TABLET | ORAL | Status: DC | PRN

## 2018-04-26 MED ORDER — DIPHENHYDRAMINE HCL 25 MG PO CAPS: 25.0000 mg | ORAL_CAPSULE | Freq: Four times a day (QID) | ORAL | Status: DC | PRN

## 2018-04-26 MED ORDER — ZIPRASIDONE MESYLATE 20 MG IM SOLR: 20.0000 mg | INTRAMUSCULAR | Status: DC | PRN

## 2018-04-26 NOTE — ED Notes (Signed)
Patient is becoming anxious and agitated he states he is ready to go home, "why are ya'll keeping me". Patient is very restless and comfortable with talking to staff, he continues to need redirection to stay in room, and not stand in the hallway.
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
## 2018-04-26 NOTE — ED Notes (Signed)
IVC/ Consult completed/ Moved to BHU/ [facility] to Admit to BMU

## 2018-04-26 NOTE — ED Notes (Signed)
Hourly rounding reveals patient in room. No complaints, stable, in no acute distress. Q15 minute rounds and monitoring via Security Cameras to continue. 

## 2018-04-26 NOTE — ED Notes (Signed)
Report to include situation, background, assessment and recommendations from Jadeka RN. Patient sleeping, respirations regular and unlabored. Q15 minute rounds and security camera observation to continue.   

## 2018-04-26 NOTE — ED Notes (Signed)
Patient assigned to appropriate care area   Introduced self to pt  Patient oriented to unit/care area: Informed that, for their safety, care areas are designed for safety and visiting and phone hours explained to patient. Patient verbalizes understanding, and verbal contract for safety obtained  Environment secured Patient keeps saying I dont need to be here, and he is ready to go home.

## 2018-04-26 NOTE — ED Notes (Signed)
Pt given cup of water and [place] sandwich tray.

## 2018-04-26 NOTE — ED Provider Notes (Signed)
[facility] Emergency Department Provider Note   ____________________________________________   First MD Initiated Contact with Patient 04/26/18 1153     (approximate)  I have reviewed the triage vital signs and the nursing notes.   HISTORY  Chief Complaint Psychiatric Evaluation   HPI Larry Walter is a 43 y.o. male who is brought in under commitment.  Commitment papers and has a diagnosis of schizophrenia seems to be psychotic believing he has Superman powers making threats would kill everyone in his home and saying that he was God and has all his powers.  Patient was not threatening to me but did say that his son had been in heaven and he is come back down again and he is going to see him again and he is very happy to see his son again.  He is also saying other things that make absolutely no sense to me and saying that he has been sleeping with a lot of different women in need to STD test which have ordered.  Patient denies any discharge.         Past Medical History:  Diagnosis Date  . Anxiety   . Bipolar disorder (HCC)   . Depression   . Heart murmur   . Hypertension     Patient Active Problem List   Diagnosis Date Noted  . Schizophrenia (HCC) 02/28/2018  . Marijuana abuse, continuous 02/28/2018  . Alcohol use disorder, severe, dependence (HCC) 02/28/2018    History reviewed. No pertinent surgical history.  Prior to Admission medications   Medication Sig Start Date End Date Taking? Authorizing Provider  haloperidol (HALDOL) 10 MG tablet Take 1 tablet (10 mg total) by mouth 2 (two) times daily. 03/06/18   Clapacs, Jackquline Denmark, MD  haloperidol decanoate (HALDOL DECANOATE) 100 MG/ML injection Inject 2 mLs (200 mg total) into the muscle every 28 (twenty-eight) days. 04/03/18   Clapacs, Jackquline Denmark, MD  traZODone (DESYREL) 100 MG tablet Take 1 tablet (100 mg total) by mouth at bedtime as needed for sleep. 03/06/18   Clapacs, Jackquline Denmark, MD     Allergies Penicillins  No family history on file.  Social History Social History   Tobacco Use  . Smoking status: Current Every Day Smoker  . Smokeless tobacco: Former Engineer, water Use Topics  . Alcohol use: Not on file  . Drug use: Not on file    Review of Systems  Constitutional: No fever/chills Eyes: No visual changes. ENT: No sore throat. Cardiovascular: Denies chest pain. Respiratory: Denies shortness of breath. Gastrointestinal: No abdominal pain.  No nausea, no vomiting.  No diarrhea.  No constipation. Genitourinary: Negative for dysuria. Musculoskeletal: Negative for back pain. Skin: Negative for rash. Neurological: Negative for headaches, focal weakness   ____________________________________________   PHYSICAL EXAM:  VITAL SIGNS: ED Triage Vitals  Enc Vitals Group     BP 04/26/18 1057 (!) 136/94     Pulse Rate 04/26/18 1057 85     Resp 04/26/18 1057 16     Temp 04/26/18 1057 98.4 F (36.9 C)     Temp Source 04/26/18 1057 Oral     SpO2 04/26/18 1057 99 %     Weight 04/26/18 1057 200 lb (90.7 kg)     Height 04/26/18 1057 6\' 2"  (1.88 m)     Head Circumference --      Peak Flow --      Pain Score 04/26/18 1100 0     Pain Loc --  Pain Edu? --      Excl. in GC? --     Constitutional: Alert and oriented. Well appearing and in no acute distress. Eyes: Conjunctivae are normal.  Head: Atraumatic. Nose: No congestion/rhinnorhea. Mouth/Throat: Mucous membranes are moist.  Oropharynx non-erythematous. Neck: No stridor. Cardiovascular: Normal rate, regular rhythm. Grossly normal heart sounds.  Good peripheral circulation. Respiratory: Normal respiratory effort.  No retractions. Lungs CTAB. Gastrointestinal: Soft and nontender. No distention. No abdominal bruits. No CVA tenderness. Musculoskeletal: No lower extremity tenderness nor edema. Neurologic:  Normal speech and language except as noted in HPI. No gross focal neurologic deficits are  appreciated. No gait instability. Skin:  Skin is warm, dry and intact. No rash noted. Psychiatric: Mood and affect are normal. Speech and behavior are normal except as noted in HPI.  ____________________________________________   LABS (all labs ordered are listed, but only abnormal results are displayed)  Labs Reviewed  COMPREHENSIVE METABOLIC PANEL - Abnormal; Notable for the following components:      Result Value   Total Protein 8.9 (*)    All other components within normal limits  ETHANOL - Abnormal; Notable for the following components:   Alcohol, Ethyl (B) 109 (*)    All other components within normal limits  CBC - Abnormal; Notable for the following components:   WBC 12.3 (*)    RDW 16.5 (*)    All other components within normal limits  URINE DRUG SCREEN, QUALITATIVE ([facility] ONLY) - Abnormal; Notable for the following components:   Cannabinoid 50 Ng, Ur Sullivan POSITIVE (*)    All other components within normal limits  CHLAMYDIA/NGC RT PCR ([facility] ONLY)  RPR   ____________________________________________  EKG   ____________________________________________  RADIOLOGY  ED MD interpretation:   Official radiology report(s): Ct Head Wo Contrast  Result Date: 04/26/2018 CLINICAL DATA:  Altered mental status. EXAM: CT HEAD WITHOUT CONTRAST TECHNIQUE: Contiguous axial images were obtained from the base of the skull through the vertex without intravenous contrast. COMPARISON:  None. FINDINGS: Brain: There is no evidence of acute infarct, intracranial hemorrhage, mass, midline shift, or extra-axial fluid collection. The ventricles and sulci are normal. Vascular: No hyperdense vessel. Skull: No fracture or focal osseous lesion. Sinuses/Orbits: Mild mucosal thickening in the included paranasal sinuses. Small right mastoid effusion. Unremarkable orbits. Other: None. IMPRESSION: Unremarkable CT appearance of the brain. Electronically Signed   By: Sebastian Ache M.D.   On: 04/26/2018  12:38    ____________________________________________   PROCEDURES  Procedure(s) performed (including Critical Care):  Procedures   ____________________________________________   INITIAL IMPRESSION / ASSESSMENT AND PLAN / ED COURSE                ____________________________________________   FINAL CLINICAL IMPRESSION(S) / ED DIAGNOSES  Final diagnoses:  Psychosis, unspecified psychosis type [facility])     ED Discharge Orders    None       Note:  This document was prepared using Dragon voice recognition software and may include unintentional dictation errors.    Arnaldo Natal, MD 04/26/18 [phone]

## 2018-04-26 NOTE — ED Triage Notes (Signed)
Brought by police ivc.

## 2018-04-26 NOTE — BH Assessment (Signed)
Patient was denied at [facility].   Patient referral information sent to the following via Epic fax:  [facility]  [facility]  [facility]  [facility]  [facility]  [facility]  [facility]  [facility]  [facility]  [facility]  [facility]  [facility]  [facility]  [place]  [facility]

## 2018-04-26 NOTE — ED Notes (Signed)
IVC/ Consult Completed/ Plan to Admit to [facility]
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
## 2018-04-26 NOTE — ED Notes (Signed)
Pt dressed out into appropriate behavioral health clothing with this tech and Brett,EDT in the rm. Pt belongings consist of of black/gray tennis shoes, white shirt, white socks, blue jeans, black belt, gray boxers and a black cell phone. Pt has a black wallet with $131 in cash and $2.64 in change that pt does not want locked up. Pt stated "I aint going to stay here. I am going to be in and out. I don't have no reason to be here." Pt calm and cooperative while dressing out. Pt belongings placed in a pt belongings bag and labeled with pt name.

## 2018-04-27 LAB — RPR: RPR Ser Ql: NONREACTIVE

## 2018-04-27 NOTE — ED Notes (Signed)
Hourly rounding reveals patient in room. No complaints, stable, in no acute distress. Q15 minute rounds and monitoring via Security Cameras to continue. 

## 2018-04-27 NOTE — ED Notes (Signed)
emtala reviewed by this RN 

## 2018-04-27 NOTE — BH Assessment (Signed)
Patient has been accepted to [facility].  Patient assigned to room Unit [facility] Accepting physician is Dr. Etheleen Nicks.  Call report to [phone].  Representative was [employer].  Can arrive after 11am tomorrow morning ER Staff is aware of it:  [facility] ER Secretary  Dr. York Cerise, ER MD  [facility] Patient's Nurse

## 2018-04-27 NOTE — ED Notes (Signed)
Patient eating breakfast, no signs of distress at this time.

## 2018-04-27 NOTE — ED Notes (Signed)
Patient was told that He was going to be transported to [facility] for treatment and He said" I hope yall all die" Patient agitated, but told Sheriff that " Yes I am going because I don't have a choice" Patient walked out with officer calmly, all belongings sent with officer in transport.

## 2018-06-08 ENCOUNTER — Other Ambulatory Visit: Payer: Self-pay

## 2018-06-08 ENCOUNTER — Emergency Department
Admission: EM | Admit: 2018-06-08 | Discharge: 2018-06-09 | Disposition: A | Discharge status: Other Acute Inpatient Hospital | Payer: Medicare Other | Attending: Emergency Medicine | Admitting: Emergency Medicine

## 2018-06-08 ENCOUNTER — Encounter: Payer: Self-pay | Admitting: Intensive Care

## 2018-06-08 DIAGNOSIS — F17200 Nicotine dependence, unspecified, uncomplicated: Secondary | ICD-10-CM | POA: Insufficient documentation

## 2018-06-08 DIAGNOSIS — Z79899 Other long term (current) drug therapy: Secondary | ICD-10-CM | POA: Insufficient documentation

## 2018-06-08 DIAGNOSIS — F209 Schizophrenia, unspecified: Secondary | ICD-10-CM | POA: Insufficient documentation

## 2018-06-08 DIAGNOSIS — F1020 Alcohol dependence, uncomplicated: Secondary | ICD-10-CM | POA: Insufficient documentation

## 2018-06-08 DIAGNOSIS — F1220 Cannabis dependence, uncomplicated: Secondary | ICD-10-CM | POA: Insufficient documentation

## 2018-06-08 DIAGNOSIS — I10 Essential (primary) hypertension: Secondary | ICD-10-CM | POA: Insufficient documentation

## 2018-06-08 LAB — COMPREHENSIVE METABOLIC PANEL
ALT: 12 U/L (ref 0–44)
AST: 26 U/L (ref 15–41)
Albumin: 3.6 g/dL (ref 3.5–5.0)
Alkaline Phosphatase: 80 U/L (ref 38–126)
Anion gap: 7 (ref 5–15)
BUN: 11 mg/dL (ref 6–20)
CO2: 24 mmol/L (ref 22–32)
Calcium: 8.7 mg/dL — ABNORMAL LOW (ref 8.9–10.3)
Chloride: 108 mmol/L (ref 98–111)
Creatinine, Ser: 0.93 mg/dL (ref 0.61–1.24)
GFR calc Af Amer: >60 mL/min (ref >60)
GFR calc non Af Amer: >60 mL/min (ref >60)
Glucose, Bld: 123 mg/dL — ABNORMAL HIGH (ref 70–99)
Potassium: 3.6 mmol/L (ref 3.5–5.1)
Sodium: 139 mmol/L (ref 135–145)
Total Bilirubin: 0.2 mg/dL — ABNORMAL LOW (ref 0.3–1.2)
Total Protein: 7.4 g/dL (ref 6.5–8.1)

## 2018-06-08 LAB — CBC WITH DIFFERENTIAL/PLATELET
Abs Immature Granulocytes: 0.04 10*3/uL (ref 0.00–0.07)
Basophils Absolute: 0.1 10*3/uL (ref 0.0–0.1)
Basophils Relative: 0 %
Eosinophils Absolute: 0.1 10*3/uL (ref 0.0–0.5)
Eosinophils Relative: 1 %
HCT: 40.9 % (ref 39.0–52.0)
Hemoglobin: 13.6 g/dL (ref 13.0–17.0)
Immature Granulocytes: 0 %
Lymphocytes Relative: 24 %
Lymphs Abs: 3.5 10*3/uL (ref 0.7–4.0)
MCH: 30.1 pg (ref 26.0–34.0)
MCHC: 33.3 g/dL (ref 30.0–36.0)
MCV: 90.5 fL (ref 80.0–100.0)
Monocytes Absolute: 1 10*3/uL (ref 0.1–1.0)
Monocytes Relative: 7 %
Neutro Abs: 9.7 10*3/uL — ABNORMAL HIGH (ref 1.7–7.7)
Neutrophils Relative %: 68 %
Platelets: 268 10*3/uL (ref 150–400)
RBC: 4.52 MIL/uL (ref 4.22–5.81)
RDW: 16.8 % — ABNORMAL HIGH (ref 11.5–15.5)
WBC: 14.4 10*3/uL — ABNORMAL HIGH (ref 4.0–10.5)
nRBC: 0 % (ref 0.0–0.2)

## 2018-06-08 LAB — ETHANOL: Alcohol, Ethyl (B): <10 mg/dL (ref <10)

## 2018-06-08 LAB — SALICYLATE LEVEL: Salicylate Lvl: <7 mg/dL (ref 2.8–30.0)

## 2018-06-08 LAB — ACETAMINOPHEN LEVEL: Acetaminophen (Tylenol), Serum: <10 ug/mL — ABNORMAL LOW (ref 10–30)

## 2018-06-08 MED ORDER — LORAZEPAM 2 MG/ML IJ SOLN
2.0000 mg | Freq: Once | INTRAMUSCULAR | Status: AC
  Administered 2018-06-08: 2 mg via INTRAMUSCULAR
  Filled 2018-06-08: qty 1

## 2018-06-08 MED ORDER — HALOPERIDOL LACTATE 5 MG/ML IJ SOLN
5.0000 mg | Freq: Once | INTRAMUSCULAR | Status: AC
  Administered 2018-06-08: 5 mg via INTRAMUSCULAR
  Filled 2018-06-08: qty 1

## 2018-06-08 NOTE — ED Notes (Signed)
BEHAVIORAL HEALTH ROUNDING Patient sleeping: No. Patient alert and oriented: yes Behavior appropriate: Yes.  ; If no, describe:  Nutrition and fluids offered: yes Toileting and hygiene offered: Yes  Sitter present: q15 minute observations and security  monitoring Law enforcement present: Yes  ODS  

## 2018-06-08 NOTE — ED Notes (Signed)
Unable to obtain blood work.  Pt threatening to "woop my ass" if I try to get his blood work. Unable to obtain PT's underwear.  Pt threatening to "woop my ass" if I try to make him change his underwear.  Police at the time said "it is not worth fighting with him over underwear"

## 2018-06-08 NOTE — ED Notes (Signed)
This tech asked pt if I could draw pt blood while in triage. Pt stated "hell mother fucking no you ain't about to use my blood to cure no mother fuckers."  Pt made aware that we are not going to use his blood to cure anyone and that we need it to clear him medically. Pt still refusing.

## 2018-06-08 NOTE — ED Notes (Signed)
Report given to SOC MD.  

## 2018-06-08 NOTE — ED Notes (Signed)
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
## 2018-06-08 NOTE — ED Notes (Signed)
IVC prior to arrival/ Psych Consult ordered 

## 2018-06-08 NOTE — ED Notes (Signed)
While taking pt to a triage rm to be triaged, pt stated "I have the fucking coronavirus and I am about to cough on all of you mother fuckers."

## 2018-06-08 NOTE — ED Provider Notes (Addendum)
[facility] Emergency Department Provider Note  ____________________________________________   I have reviewed the triage vital signs and the nursing notes. Where available I have reviewed prior notes and, if possible and indicated, outside hospital notes.    HISTORY  Chief Complaint No chief complaint on file.    HPI Larry Walter is a 43 y.o. male who presents today complaining of long string of profanities and then he had something out how he was coming from the sky.  [facility] because allegedly he had a knife and was acting bizarre.  Does have a history of bipolar disorder and he has a history of psychosis in the past.  He is very reluctant to participate in the exam although he has stated that he does not have any SI or HI and that everything that has been set about him is a lie.   Past Medical History:  Diagnosis Date  . Anxiety   . Bipolar disorder (HCC)   . Depression   . Heart murmur   . Hypertension     Patient Active Problem List   Diagnosis Date Noted  . Schizophrenia (HCC) 02/28/2018  . Marijuana abuse, continuous 02/28/2018  . Alcohol use disorder, severe, dependence (HCC) 02/28/2018    History reviewed. No pertinent surgical history.  Prior to Admission medications   Medication Sig Start Date End Date Taking? Authorizing Provider  haloperidol (HALDOL) 10 MG tablet Take 1 tablet (10 mg total) by mouth 2 (two) times daily. 03/06/18   Clapacs, Jackquline DenmarkJohn T, MD  haloperidol decanoate (HALDOL DECANOATE) 100 MG/ML injection Inject 2 mLs (200 mg total) into the muscle every 28 (twenty-eight) days. 04/03/18   Clapacs, Jackquline DenmarkJohn T, MD  traZODone (DESYREL) 100 MG tablet Take 1 tablet (100 mg total) by mouth at bedtime as needed for sleep. 03/06/18   Clapacs, Jackquline DenmarkJohn T, MD    Allergies Penicillins  History reviewed. No pertinent family history.  Social History Social History   Tobacco Use  . Smoking  status: Current Every Day Smoker  . Smokeless tobacco: Former Engineer, waterUser  Substance Use Topics  . Alcohol use: Yes  . Drug use: Yes    Types: Marijuana    Review of Systems Limited secondary to patient disinclination to participate in exam   ____________________________________________   PHYSICAL EXAM:  VITAL SIGNS: ED Triage Vitals  Enc Vitals Group     BP 06/08/18 1637 (!) 148/96     Pulse Rate 06/08/18 1637 (!) 104     Resp 06/08/18 1637 20     Temp 06/08/18 1637 99.2 F (37.3 C)     Temp Source 06/08/18 1637 Oral     SpO2 06/08/18 1637 96 %     Weight 06/08/18 1639 200 lb (90.7 kg)     Height 06/08/18 1639 6' (1.829 m)     Head Circumference --      Peak Flow --      Pain Score 06/08/18 1643 0     Pain Loc --      Pain Edu? --      Excl. in GC? --     Constitutional: Alert and in no acute medical distress agitated and angry however Eyes: Conjunctivae are normal Head: Atraumatic HEENT: No congestion/rhinnorhea. Mucous membranes are moist.  Oropharynx non-erythematous Neck:   Nontender with no meningismus, no masses, no stridor Cardiovascular: Normal rate, regular rhythm. Grossly normal heart sounds.  Good peripheral circulation. Respiratory: Normal respiratory effort.  No retractions. Lungs CTAB. Abdominal: Soft and nontender. No distention. No guarding no rebound Back:  There is no focal tenderness or step off.  there is no midline tenderness there are no lesions noted. there is no CVA tenderness Musculoskeletal: No lower extremity tenderness, no upper extremity tenderness. No joint effusions, no DVT signs strong distal pulses no edema Neurologic:  Normal speech and language. No gross focal neurologic deficits are appreciated.  Skin:  Skin is warm, dry and intact. No rash noted. Psychiatric: Mood and affect are agitated and angry and bizarre  ____________________________________________   LABS (all labs ordered are listed, but only abnormal results are  displayed)  Labs Reviewed  ACETAMINOPHEN LEVEL  COMPREHENSIVE METABOLIC PANEL  CBC WITH DIFFERENTIAL/PLATELET  URINE DRUG SCREEN, QUALITATIVE ([facility] ONLY)  URINALYSIS, COMPLETE ([facility]) WITH MICROSCOPIC  SALICYLATE LEVEL  ETHANOL    Pertinent labs  results that were available during my care of the patient were reviewed by me and considered in my medical decision making (see chart for details). ____________________________________________  EKG  I personally interpreted any EKGs ordered by me or triage  ____________________________________________  RADIOLOGY  Pertinent labs & imaging results that were available during my care of the patient were reviewed by me and considered in my medical decision making (see chart for details). If possible, patient and/or family made aware of any abnormal findings.  No results found. ____________________________________________    PROCEDURES  Procedure(s) performed: None  Procedures  Critical Care performed: None  ____________________________________________   INITIAL IMPRESSION / ASSESSMENT AND PLAN / ED COURSE  Pertinent labs & imaging results that were available during my care of the patient were reviewed by me and considered in my medical decision making (see chart for details).  Patient did tolerate a quick exam for me before getting very angry and upset and refusing blood refusing any care and stating that he did not care what he did he was going to stay here forever if he decided he wanted to another things that did not make much sense.  We did ultimately have to give him a little bit of sedating medications as he was in a state of psychomotor agitation and I think presenting danger to himself and staff.  Patient has been placed under IVC, he is in no acute distress physically and there is no evidence of acute pathology noted on the exam to the extent that he lets me do 1.  We will check for routine overdose etc. for medical clearance  and we will see about disposition after that  ----------------------------------------- 11:35 PM on 06/08/2018 -----------------------------------------  he has been much more calm and has not threatened any violence since we gave him the Ativan and Haldol, signed out to Dr. Manson Passey at the end of my shift    ____________________________________________   FINAL CLINICAL IMPRESSION(S) / ED DIAGNOSES  Final diagnoses:  None      This chart was dictated using voice recognition software.  Despite best efforts to proofread,  errors can occur which can change meaning.      Jeanmarie Plant, MD 06/08/18 4665    Jeanmarie Plant, MD 06/08/18 [phone]
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
## 2018-06-09 ENCOUNTER — Inpatient Hospital Stay
Admission: AD | Admit: 2018-06-09 | Discharge: 2018-06-13 | DRG: 885 | Disposition: A | Discharge status: Home / Self Care | Payer: Medicare Other | Source: Intra-hospital | Attending: Psychiatry | Admitting: Psychiatry

## 2018-06-09 ENCOUNTER — Other Ambulatory Visit: Payer: Self-pay

## 2018-06-09 DIAGNOSIS — F1020 Alcohol dependence, uncomplicated: Secondary | ICD-10-CM | POA: Diagnosis present

## 2018-06-09 DIAGNOSIS — F209 Schizophrenia, unspecified: Secondary | ICD-10-CM | POA: Diagnosis present

## 2018-06-09 DIAGNOSIS — F1220 Cannabis dependence, uncomplicated: Secondary | ICD-10-CM

## 2018-06-09 DIAGNOSIS — R011 Cardiac murmur, unspecified: Secondary | ICD-10-CM | POA: Diagnosis present

## 2018-06-09 DIAGNOSIS — Z79899 Other long term (current) drug therapy: Secondary | ICD-10-CM

## 2018-06-09 DIAGNOSIS — I10 Essential (primary) hypertension: Secondary | ICD-10-CM | POA: Diagnosis present

## 2018-06-09 DIAGNOSIS — F319 Bipolar disorder, unspecified: Secondary | ICD-10-CM | POA: Diagnosis present

## 2018-06-09 DIAGNOSIS — F203 Undifferentiated schizophrenia: Secondary | ICD-10-CM | POA: Diagnosis not present

## 2018-06-09 DIAGNOSIS — F419 Anxiety disorder, unspecified: Secondary | ICD-10-CM | POA: Diagnosis present

## 2018-06-09 DIAGNOSIS — Z880 Allergy status to penicillin: Secondary | ICD-10-CM | POA: Diagnosis not present

## 2018-06-09 DIAGNOSIS — F1210 Cannabis abuse, uncomplicated: Secondary | ICD-10-CM | POA: Diagnosis present

## 2018-06-09 DIAGNOSIS — F200 Paranoid schizophrenia: Secondary | ICD-10-CM

## 2018-06-09 LAB — URINE DRUG SCREEN, QUALITATIVE (ARMC ONLY)
Amphetamines, Ur Screen: NOT DETECTED
Barbiturates, Ur Screen: NOT DETECTED
Benzodiazepine, Ur Scrn: POSITIVE — AB
Cannabinoid 50 Ng, Ur ~~LOC~~: POSITIVE — AB
Cocaine Metabolite,Ur ~~LOC~~: NOT DETECTED
MDMA (Ecstasy)Ur Screen: NOT DETECTED
Methadone Scn, Ur: NOT DETECTED
Opiate, Ur Screen: NOT DETECTED
Phencyclidine (PCP) Ur S: NOT DETECTED
Tricyclic, Ur Screen: NOT DETECTED

## 2018-06-09 MED ORDER — LORAZEPAM 2 MG/ML IJ SOLN: 2.0000 mg | Freq: Four times a day (QID) | INTRAMUSCULAR | Status: DC

## 2018-06-09 MED ORDER — ZIPRASIDONE MESYLATE 20 MG IM SOLR: 20.0000 mg | Freq: Two times a day (BID) | INTRAMUSCULAR | Status: DC | PRN

## 2018-06-09 MED ORDER — HALOPERIDOL LACTATE 5 MG/ML IJ SOLN: 5.0000 mg | Freq: Four times a day (QID) | INTRAMUSCULAR | Status: DC | PRN

## 2018-06-09 MED ORDER — MAGNESIUM HYDROXIDE 400 MG/5ML PO SUSP: 30.0000 mL | Freq: Every day | ORAL | Status: DC | PRN

## 2018-06-09 MED ORDER — HALOPERIDOL 5 MG PO TABS: 5.0000 mg | ORAL_TABLET | Freq: Four times a day (QID) | ORAL | Status: DC | PRN

## 2018-06-09 MED ORDER — ALUM & MAG HYDROXIDE-SIMETH 200-200-20 MG/5ML PO SUSP: 30.0000 mL | ORAL | Status: DC | PRN

## 2018-06-09 MED ORDER — CHLORDIAZEPOXIDE HCL 25 MG PO CAPS: 25.0000 mg | ORAL_CAPSULE | Freq: Three times a day (TID) | ORAL | Status: DC | PRN

## 2018-06-09 MED ORDER — ACETAMINOPHEN 325 MG PO TABS: 650.0000 mg | ORAL_TABLET | Freq: Four times a day (QID) | ORAL | Status: DC | PRN

## 2018-06-09 MED ORDER — QUETIAPINE FUMARATE 200 MG PO TABS
200.0000 mg | ORAL_TABLET | Freq: Every day | ORAL | Status: DC
  Administered 2018-06-09 – 2018-06-12 (×4): 200 mg via ORAL
  Filled 2018-06-09 (×4): qty 1

## 2018-06-09 MED ORDER — LORAZEPAM 2 MG PO TABS: 2.0000 mg | ORAL_TABLET | Freq: Four times a day (QID) | ORAL | Status: DC | PRN

## 2018-06-09 MED ORDER — LORAZEPAM 2 MG/ML IJ SOLN: 2.0000 mg | Freq: Four times a day (QID) | INTRAMUSCULAR | Status: DC | PRN

## 2018-06-09 MED ORDER — TRAZODONE HCL 100 MG PO TABS: 100.0000 mg | ORAL_TABLET | Freq: Every evening | ORAL | Status: DC | PRN

## 2018-06-09 MED ORDER — LORAZEPAM 2 MG PO TABS
2.0000 mg | ORAL_TABLET | Freq: Four times a day (QID) | ORAL | Status: DC
  Administered 2018-06-09: 17:00:00 2 mg via ORAL
  Filled 2018-06-09: qty 1

## 2018-06-09 MED ORDER — DIPHENHYDRAMINE HCL 50 MG/ML IJ SOLN: 25.0000 mg | Freq: Four times a day (QID) | INTRAMUSCULAR | Status: DC | PRN

## 2018-06-09 MED ORDER — DIPHENHYDRAMINE HCL 25 MG PO CAPS: 50.0000 mg | ORAL_CAPSULE | Freq: Four times a day (QID) | ORAL | Status: DC | PRN

## 2018-06-09 MED ORDER — LORAZEPAM 1 MG PO TABS: 1.0000 mg | ORAL_TABLET | ORAL | Status: DC | PRN

## 2018-06-09 NOTE — ED Notes (Signed)
Pt. Moved from quad #23 to BHU #1.  Pt. Calm and cooperative at this time.  Pt. Asked when breakfast was being served.  Pt. Has no questions or concerns at this time.  Pt. Stated he would come to nursing station for any questions or concerns.

## 2018-06-09 NOTE — Progress Notes (Signed)
D: Pt during assessments denies SI/HI/AVH, able to contract for safety. Pt is mostly pleasant and cooperative, but over all minimal. Pt. Describes a mostly normal mood, stating, "It's alright". No behavioral issues to report.   A: Q x 15 minute observation checks were completed for safety. Patient was provided with education. Patient was given/offered medications per orders. Patient  was encourage to attend groups, participate in unit activities and continue with plan of care. Pt. Chart and plans of care reviewed. Pt. Given support and encouragement.   R: Patient is complaint with medications and unit procedures this evening. Pt. Attends snack time, observed eating good. Pt. Monitored per CIWA scoring. Pt. Screened for covid.              Precautionary checks every 15 minutes for safety maintained, room free of safety hazards, patient sustains no injury or falls during this shift. Will endorse care to next shift.

## 2018-06-09 NOTE — ED Notes (Signed)
Pt given lunch tray.

## 2018-06-09 NOTE — ED Notes (Signed)
Harriet from [facility] called to check on patient status.  Ms. Larry Walter would like to be notified when patient has a bed placement.  [phone]

## 2018-06-09 NOTE — Progress Notes (Addendum)
Patient states "the F.. people at [facility] made false paper on me and put me here."Patient is irritable and reluctant answer questions during admission.Patient states "I am God.thats why they brought me here."Denies SI,HI and AVH at this time. Patient informed of fall risk status, fall risk assessed "low" at this time. Patient oriented to unit/staff/room. Patient denies any questions/concerns at this time. Patient safe on unit with Q15 minute checks for safety. Skin assessment and body search done.No contraband found.

## 2018-06-09 NOTE — Consult Note (Signed)
[facility] Psych ED Progress Note  06/09/2018 12:12 PM Larry Walter  MRN:  [id number]   Subjective: Patient is a 43 year old African-American male currently under IVC for bizarre and threatening behavior  Chief complaint: "That asshole is messing with me"   HPI: Patient is a 43 year old African-American male, brought to the hospital by police for police were called for his threatening and bizarre behavior.  According to paperwork the patient has been displaying aggression and threatening behavior, has been fixated that people are stealing his belongings.  At one point the patient was found walking down the road with his 3 children and the patient had a butcher knife on him.  He received IM medications in the emergency department after becoming agitated and threatening.  On interview this morning the patient allowed a brief interview regarding his situation prior to the police being involved.  He is under the impression that he was being threatened by his family, and that his father was threatening to "beat him and cut him", he reports that his family members have been "fucking with my head".  He reports that at the time of his interactions with police that he was going to attack his father before he could be attacked.  Otherwise the patient was guarded and uncooperative with his evaluation, he sprinkled multiple bizarre statements throughout his interview such as "the KKK in [place] is walking and molasses".  He made several demands for more food, and stated he does not want any medications because they "make him high".  He did mention that he smokes 6-7 blunts per day, and smokes and drinks "whenever I can".  Unfortunately he would not cooperate with a formal psychiatric review of systems.   Principal Problem: Schizophrenia   Diagnosis:  Schizophrenia Cannabis use disorder, severe Alcohol use disorder, severe Hx of hypertension   Total Time spent with patient: 45 minutes  Past Psychiatric  History: Multiple past hospitalizations for bizarre, threatening behavior related to substance use, paranoia and underlying schizophrenia.  He has 2 past presentations to the emergency department for similar issues.  Last hospitalized at [facility] in January 2020.  Denies past suicide attempts, reports he is compliant with his patient medication which is a Haldol injection.  States that his last injection was 2 weeks ago.  Past Medical History:  Past Medical History:  Diagnosis Date  . Anxiety   . Bipolar disorder (HCC)   . Depression   . Heart murmur   . Hypertension    History reviewed. No pertinent surgical history. Family History: History reviewed. No pertinent family history. Family Psychiatric  History: Denies Social History:  Social History   Substance and Sexual Activity  Alcohol Use Yes     Social History   Substance and Sexual Activity  Drug Use Yes  . Types: Marijuana    Social History   Socioeconomic History  . Marital status: Single    Spouse name: Not on file  . Number of children: Not on file  . Years of education: Not on file  . Highest education level: Not on file  Occupational History  . Not on file  Social Needs  . Financial resource strain: Not on file  . Food insecurity:    Worry: Not on file    Inability: Not on file  . Transportation needs:    Medical: Not on file    Non-medical: Not on file  Tobacco Use  . Smoking status: Current Every Day Smoker  . Smokeless tobacco: Former Neurosurgeon  Substance and Sexual Activity  . Alcohol use: Yes  . Drug use: Yes    Types: Marijuana  . Sexual activity: Not on file  Lifestyle  . Physical activity:    Days per week: Not on file    Minutes per session: Not on file  . Stress: Not on file  Relationships  . Social connections:    Talks on phone: Not on file    Gets together: Not on file    Attends religious service: Not on file    Active member of club or organization: Not on file    Attends meetings of clubs  or organizations: Not on file    Relationship status: Not on file  Other Topics Concern  . Not on file  Social History Narrative  . Not on file    Sleep: Poor  Appetite:  Good  Current Medications: No current facility-administered medications for this encounter.    Current Outpatient Medications  Medication Sig Dispense Refill  . gabapentin (NEURONTIN) 300 MG capsule Take 300 mg by mouth 3 (three) times daily.    . haloperidol (HALDOL) 10 MG tablet Take 1 tablet (10 mg total) by mouth 2 (two) times daily. (Patient taking differently: Take 10 mg by mouth 3 (three) times daily. ) 60 tablet 0  . haloperidol decanoate (HALDOL DECANOATE) 100 MG/ML injection Inject 2 mLs (200 mg total) into the muscle every 28 (twenty-eight) days. 2 mL 1  . QUEtiapine (SEROQUEL) 200 MG tablet Take 200 mg by mouth daily.    . traZODone (DESYREL) 100 MG tablet Take 1 tablet (100 mg total) by mouth at bedtime as needed for sleep. 30 tablet 0    Lab Results:  Results for orders placed or performed during the hospital encounter of 06/08/18 (from the past 48 hour(s))  Acetaminophen level     Status: Abnormal   Collection Time: 06/08/18  8:55 PM  Result Value Ref Range   Acetaminophen (Tylenol), Serum <10 (L) 10 - 30 ug/mL    Comment: (NOTE) Therapeutic concentrations vary significantly. A range of 10-30 ug/mL  may be an effective concentration for many patients. However, some  are best treated at concentrations outside of this range. Acetaminophen concentrations >150 ug/mL at 4 hours after ingestion  and >50 ug/mL at 12 hours after ingestion are often associated with  toxic reactions. Performed at [facility], [address] [place], [place] [address]   Comprehensive metabolic panel     Status: Abnormal   Collection Time: 06/08/18  8:55 PM  Result Value Ref Range   Sodium 139 135 - 145 mmol/L   Potassium 3.6 3.5 - 5.1 mmol/L   Chloride 108 98 - 111 mmol/L   CO2 24 22 - 32 mmol/L    Glucose, Bld 123 (H) 70 - 99 mg/dL   BUN 11 6 - 20 mg/dL   Creatinine, Ser 4.090.93 0.61 - 1.24 mg/dL   Calcium 8.7 (L) 8.9 - 10.3 mg/dL   Total Protein 7.4 6.5 - 8.1 g/dL   Albumin 3.6 3.5 - 5.0 g/dL   AST 26 15 - 41 U/L   ALT 12 0 - 44 U/L   Alkaline Phosphatase 80 38 - 126 U/L   Total Bilirubin 0.2 (L) 0.3 - 1.2 mg/dL   GFR calc non Af Amer >60 >60 mL/min   GFR calc Af Amer >60 >60 mL/min   Anion gap 7 5 - 15    Comment: Performed at [facility], [address] [place],   [address]  CBC with Differential     Status: Abnormal   Collection Time: 06/08/18  8:55 PM  Result Value Ref Range   WBC 14.4 (H) 4.0 - 10.5 K/uL   RBC 4.52 4.22 - 5.81 MIL/uL   Hemoglobin 13.6 13.0 - 17.0 g/dL   HCT 60.4 54.0 - 98.1 %   MCV 90.5 80.0 - 100.0 fL   MCH 30.1 26.0 - 34.0 pg   MCHC 33.3 30.0 - 36.0 g/dL   RDW 19.1 (H) 47.8 - 29.5 %   Platelets 268 150 - 400 K/uL   nRBC 0.0 0.0 - 0.2 %   Neutrophils Relative % 68 %   Neutro Abs 9.7 (H) 1.7 - 7.7 K/uL   Lymphocytes Relative 24 %   Lymphs Abs 3.5 0.7 - 4.0 K/uL   Monocytes Relative 7 %   Monocytes Absolute 1.0 0.1 - 1.0 K/uL   Eosinophils Relative 1 %   Eosinophils Absolute 0.1 0.0 - 0.5 K/uL   Basophils Relative 0 %   Basophils Absolute 0.1 0.0 - 0.1 K/uL   Immature Granulocytes 0 %   Abs Immature Granulocytes 0.04 0.00 - 0.07 K/uL    Comment: Performed at [facility], [address] [place], [place] [address]  Salicylate level     Status: None   Collection Time: 06/08/18  8:55 PM  Result Value Ref Range   Salicylate Lvl <7.0 2.8 - 30.0 mg/dL    Comment: Performed at [facility], [address] [place], [place] [address]  Ethanol     Status: None   Collection Time: 06/08/18  8:55 PM  Result Value Ref Range   Alcohol, Ethyl (B) <10 <10 mg/dL    Comment: (NOTE) Lowest detectable limit for serum alcohol is 10 mg/dL. For medical purposes only. Performed at [facility], [address] [place], [place] [address]     Blood Alcohol level:  Lab Results  Component Value Date   ETH <10 06/08/2018   ETH 109 (H) 04/26/2018    Physical Findings: AIMS:  , ,  ,  ,    CIWA:    COWS:     Musculoskeletal: Strength & Muscle Tone: within normal limits Gait & Station: normal Patient leans: N/A  Psychiatric Specialty Exam: Physical Exam  Constitutional: He is oriented to person, place, and time. He appears well-developed and well-nourished.  HENT:  Head: Normocephalic and atraumatic.  Respiratory: Effort normal.  Neurological: He is alert and oriented to person, place, and time.    Review of Systems  Unable to perform ROS: Psychiatric disorder    Blood pressure (!) 148/96, pulse (!) 104, temperature 99.2 F (37.3 C), temperature source Oral, resp. rate 20, height 6' (1.829 m), weight 90.7 kg, SpO2 96 %.Body mass index is 27.12 kg/m.  General Appearance: Disheveled  Eye Contact:  Intense, angry at times  Speech:  labile volume, clear  Volume:  Increased  Mood:  "Pissed off"  Affect:  Defensive, hostile at times  Thought Process: Linear briefly to questions, perseveration on paranoid content  Orientation:  Full (Time, Place, and Person)  Thought Content:  Paranoia  Suicidal Thoughts:  No  Homicidal Thoughts:  Denies current, but was previously stating he was going to kill his father  Memory:  Immediate;   Fair Recent;   Fair but saturated with delusional content  Judgement:  Poor  Insight:  Lacking  Psychomotor Activity:  Normal  Concentration:  Concentration: Fair  Recall:  Fair  Fund of Knowledge:  Fair  Language:  Fair  Akathisia:  No  Handed:  Right  AIMS (if indicated):     Assets:  Housing Intimacy Physical Health  ADL's:  Intact  Cognition:  WNL  Sleep:         Treatment Plan Summary: Daily contact with patient to assess and evaluate symptoms and progress in treatment, Medication management and Plan as follows   Patient is currently  refusing to take medications, will defer to inpatient psychiatry   Disposition: Recommend acute inpatient psychiatric treatment when medically clear Per his records he is followed by the act team with Dr. Quintin Alto, DO 06/09/2018, 12:12 PM

## 2018-06-09 NOTE — Tx Team (Signed)
Initial Treatment Plan 06/09/2018 4:00 PM Larry Walter ZSW:[id number]    PATIENT STRESSORS: Medication change or noncompliance Substance abuse   PATIENT STRENGTHS: Average or above average intelligence Communication skills Physical Health   PATIENT IDENTIFIED PROBLEMS: Bipolar disorder 06/09/2018  Aggressive behavior 06/09/2018                   DISCHARGE CRITERIA:  Ability to meet basic life and health needs Medical problems require only outpatient monitoring Verbal commitment to aftercare and medication compliance  PRELIMINARY DISCHARGE PLAN: Attend aftercare/continuing care group Return to previous living arrangement  PATIENT/FAMILY INVOLVEMENT: This treatment plan has been presented to and reviewed with the patient, Larry Walter, and/or family member,   The patient and family have been given the opportunity to ask questions and make suggestions.  Leonarda Salon, RN 06/09/2018, 4:00 PM

## 2018-06-09 NOTE — BH Assessment (Signed)
Patient is to be admitted to [facility] by Dr. Katrinka Blazing.  Attending Physician will be Dr. Toni Amend.   Patient has been assigned to room 307, by [facility] Charge Nurse Gigi.   Intake Paper Work has been signed and placed on patient chart.   Dr. Marisa Severin, ER MD   Amy B., Patient's Nurse   Misty Stanley, Patient Access.

## 2018-06-09 NOTE — ED Notes (Signed)
Pt given breakfast tray

## 2018-06-09 NOTE — H&P (Signed)
Psychiatric Admission Assessment Adult  Patient Identification: Larry Walter MRN:  [id number] Date of Evaluation:  06/09/2018 Chief Complaint:  schizophrenia Principal Diagnosis: Schizophrenia (HCC) Diagnosis:  Principal Problem:   Schizophrenia (HCC) Active Problems:   Marijuana abuse, continuous   Alcohol use disorder, severe, dependence (HCC)  History of Present Illness: 43yo AAM with hx Schizophrenia, was brought to ED by Police for acute psychosis.  Pt was IVCed by his [facility] case worker, [phone].  Per IVC paper: pt was very aggressive today, triggered by his thinking that someone had stolen his cloths.  Dalaney Needle threatened to kill his parents and left his home with a butcher knife.  When [facility] case worker go to the scene, Esteen Delpriore was walking down the road with his 3 children.  Lahoma Constantin appears to be very angry and stated that "I am God".  Thus, Bresha Hosack was brought to ED by police.  UDS is pending, but pt admitted smoking marijuana daily.   Pt was seen in [facility] health unit.  Chaitanya Amedee is calm, but continues to be very delusional and psychotic.  Larri Yehle continues to believe that Ismelda Weatherman is the God.  Phelicia Dantes said "somewont put a fake ass paper on me!" as the reason Tranell Wojtkiewicz is in the hospital.  Braydin Aloi continues to say that Izaia Say is very angry, and would "Shannan Harper anyone his if F-- with me!"  Charmion Hapke said that Trenton Verne has "hundreds of children, and prbobly thousands of them " because Nesiah Jump is God.   Sabri Teal admitted that Shadow Stiggers drinks alcohol daily and smokes marijuana as stated "I smoke enough to get me high" "No one can tell me to stop!"  In ED, Ad Guttman might be more disorganized and Chun Sellen told psychiatrist Dr. Katrinka Blazing that "the KKK in [place] is walking and molasses". Laketia Vicknair told Dr. Katrinka Blazing, Oletta Buehring smokes 6-7 blunts of marijuana daily.   Spoke with his parents at [phone]: they said that Leeona Mccardle has not been doing well. Anthony Tamburo drinks alcohol from morning to evening, both beer and liquor. Jarvis Knodel smokes marijuana heavily as well.  Yesterday, mom said  "Johnothan Bascomb just snapped" after Rhen Kawecki woke up and became very aggressive.  Thus, His father called [name]'s crisis line.   Parents said that pt had a knife, but no firearms.   Spoke with [facility]or [facility] ACT case worker and RN, [phone], [phone] (crisis line). They said that pt has not been back to his baseline for about 3 months.  Vernor Monnig is getting Haldol Decanoate  q3 weeks now, and last injection was on 05/23/2018 and next one is due on 06/13/2018. Pt was supposed to take Seroquel  qhs and Trazodone  qhs and gabapentin  TID, but pt is not taking any of the po meds.    Associated Signs/Symptoms: Depression Symptoms:  depressed mood, psychomotor agitation, (Hypo) Manic Symptoms:  Delusions, Flight of Ideas, Grandiosity, Anxiety Symptoms:  none Psychotic Symptoms:  Delusions, Paranoia, PTSD Symptoms: Negative Total Time spent with patient: 1 hour  Past Psychiatric History: Multiple past hospitalizations for bizarre, threatening behavior related to substance use, paranoia and underlying schizophrenia.  Johara Lodwick has 2 past presentations to the emergency department for similar issues.  Last hospitalized at [facility] in January 2020., was discharged on Haldol decanoate.   Denies past suicide attempts, reports Tishana Clinkenbeard is compliant with his patient medication which is a Haldol injection.  States that his last injection was 2 weeks ago.  Is the patient at risk to self? No.  Has the patient been a risk  to self in the past 6 months? No.  Has the patient been a risk to self within the distant past? No.  Is the patient a risk to others? Yes.    Has the patient been a risk to others in the past 6 months? Yes.    Has the patient been a risk to others within the distant past? Yes.     Prior Inpatient Therapy:   Prior Outpatient Therapy:    Alcohol Screening:   Substance Abuse History in the last 12 months:  Yes.   Consequences of Substance Abuse: hospitalization Previous Psychotropic  Medications: Yes  Psychological Evaluations: Yes  Past Medical History:  Past Medical History:  Diagnosis Date  . Anxiety   . Bipolar disorder (HCC)   . Depression   . Heart murmur   . Hypertension    No past surgical history on file. Family History: No family history on file. Family Psychiatric  History: unknown Tobacco Screening:   Social History:  Social History   Substance and Sexual Activity  Alcohol Use Yes     Social History   Substance and Sexual Activity  Drug Use Yes  . Types: Marijuana    Additional Social History: Lives with parents.  Allergies:   Allergies  Allergen Reactions  . Penicillins Itching   Lab Results:  Results for orders placed or performed during the hospital encounter of 06/08/18 (from the past 48 hour(s))  Acetaminophen level     Status: Abnormal   Collection Time: 06/08/18  8:55 PM  Result Value Ref Range   Acetaminophen (Tylenol), Serum <10 (L) 10 - 30 ug/mL    Comment: (NOTE) Therapeutic concentrations vary significantly. A range of 10-30 ug/mL  may be an effective concentration for many patients. However, some  are best treated at concentrations outside of this range. Acetaminophen concentrations >150 ug/mL at 4 hours after ingestion  and >50 ug/mL at 12 hours after ingestion are often associated with  toxic reactions. Performed at [facility], [address] [place], [place] [address]   Comprehensive metabolic panel     Status: Abnormal   Collection Time: 06/08/18  8:55 PM  Result Value Ref Range   Sodium 139 135 - 145 mmol/L   Potassium 3.6 3.5 - 5.1 mmol/L   Chloride 108 98 - 111 mmol/L   CO2 24 22 - 32 mmol/L   Glucose, Bld 123 (H) 70 - 99 mg/dL   BUN 11 6 - 20 mg/dL   Creatinine, Ser 8.11 0.61 - 1.24 mg/dL   Calcium 8.7 (L) 8.9 - 10.3 mg/dL   Total Protein 7.4 6.5 - 8.1 g/dL   Albumin 3.6 3.5 - 5.0 g/dL   AST 26 15 - 41 U/L   ALT 12 0 - 44 U/L   Alkaline Phosphatase 80 38 - 126 U/L   Total Bilirubin 0.2  (L) 0.3 - 1.2 mg/dL   GFR calc non Af Amer >60 >60 mL/min   GFR calc Af Amer >60 >60 mL/min   Anion gap 7 5 - 15    Comment: Performed at [facility], [address] [place], [place] [address]  CBC with Differential     Status: Abnormal   Collection Time: 06/08/18  8:55 PM  Result Value Ref Range   WBC 14.4 (H) 4.0 - 10.5 K/uL   RBC 4.52 4.22 - 5.81 MIL/uL   Hemoglobin 13.6 13.0 - 17.0 g/dL   HCT 03.5 59.7 - 41.6 %   MCV 90.5 80.0 -  100.0 fL   MCH 30.1 26.0 - 34.0 pg   MCHC 33.3 30.0 - 36.0 g/dL   RDW 16.1 (H) 09.6 - 04.5 %   Platelets 268 150 - 400 K/uL   nRBC 0.0 0.0 - 0.2 %   Neutrophils Relative % 68 %   Neutro Abs 9.7 (H) 1.7 - 7.7 K/uL   Lymphocytes Relative 24 %   Lymphs Abs 3.5 0.7 - 4.0 K/uL   Monocytes Relative 7 %   Monocytes Absolute 1.0 0.1 - 1.0 K/uL   Eosinophils Relative 1 %   Eosinophils Absolute 0.1 0.0 - 0.5 K/uL   Basophils Relative 0 %   Basophils Absolute 0.1 0.0 - 0.1 K/uL   Immature Granulocytes 0 %   Abs Immature Granulocytes 0.04 0.00 - 0.07 K/uL    Comment: Performed at [facility], [address] [place], [place] [address]  Salicylate level     Status: None   Collection Time: 06/08/18  8:55 PM  Result Value Ref Range   Salicylate Lvl <7.0 2.8 - 30.0 mg/dL    Comment: Performed at [facility], [address] [place], [place] [address]  Ethanol     Status: None   Collection Time: 06/08/18  8:55 PM  Result Value Ref Range   Alcohol, Ethyl (B) <10 <10 mg/dL    Comment: (NOTE) Lowest detectable limit for serum alcohol is 10 mg/dL. For medical purposes only. Performed at [facility], [address] [place], [place] [address]     Blood Alcohol level:  Lab Results  Component Value Date   ETH <10 06/08/2018   ETH 109 (H) 04/26/2018    Metabolic Disorder Labs:  No results found for: HGBA1C, MPG No results found for: PROLACTIN No results found for: CHOL, TRIG, HDL, CHOLHDL, VLDL,  LDLCALC  Current Medications: Current Facility-Administered Medications  Medication Dose Route Frequency Provider Last Rate Last Dose  . acetaminophen (TYLENOL) tablet 650 mg  650 mg Oral Q6H PRN Luciano Cutter, DO      . alum & mag hydroxide-simeth (MAALOX/MYLANTA) 200-200-20 MG/5ML suspension 30 mL  30 mL Oral Q4H PRN Donata Clay R, DO      . ziprasidone (GEODON) injection 20 mg  20 mg Intramuscular Q12H PRN Luciano Cutter, DO       And  . LORazepam (ATIVAN) tablet 1 mg  1 mg Oral PRN Donata Clay R, DO      . magnesium hydroxide (MILK OF MAGNESIA) suspension 30 mL  30 mL Oral Daily PRN Luciano Cutter, DO       PTA Medications: Medications Prior to Admission  Medication Sig Dispense Refill Last Dose  . gabapentin (NEURONTIN) 300 MG capsule Take 300 mg by mouth 3 (three) times daily.   Past Month at Unknown time  . haloperidol (HALDOL) 10 MG tablet Take 1 tablet (10 mg total) by mouth 2 (two) times daily. (Patient taking differently: Take 10 mg by mouth 3 (three) times daily. ) 60 tablet 0 Past Month at Unknown time  . haloperidol decanoate (HALDOL DECANOATE) 100 MG/ML injection Inject 2 mLs (200 mg total) into the muscle every 28 (twenty-eight) days. 2 mL 1 Past Month at Unknown time  . QUEtiapine (SEROQUEL) 200 MG tablet Take 200 mg by mouth daily.   Past Month at Unknown time  . traZODone (DESYREL) 100 MG tablet Take 1 tablet (100 mg total) by mouth at bedtime as needed for sleep. 30 tablet 0 Past Month at Unknown time  Musculoskeletal: Strength & Muscle Tone: within normal limits Gait & Station: normal Patient leans: N/A  Psychiatric Specialty Exam: Physical Exam  ROS  There were no vitals taken for this visit.There is no height or weight on file to calculate BMI.  General Appearance: Bizarre and Fairly Groomed  Eye Contact:  Fair  Speech:  Clear and Coherent  Volume:  Normal  Mood:  Angry  Affect:  Congruent and irritable and agitated  Thought Process:  Disorganized   Orientation:  Full (Time, Place, and Person)  Thought Content:  Delusions and Paranoid Ideation  Suicidal Thoughts:  No  Homicidal Thoughts:  Yes.  without intent/plan  Memory:  Immediate;   Fair  Judgement:  Poor  Insight:  Lacking  Psychomotor Activity:  Increased  Concentration:  Concentration: Fair  Recall:  Fair  Fund of Knowledge:  Poor  Language:  Fair  Akathisia:  Negative  Handed:    AIMS (if indicated):     Assets:  Housing Physical Health  ADL's:  Intact  Cognition:  WNL  Sleep:       Treatment Plan Summary: Daily contact with patient to assess and evaluate symptoms and progress in treatment and Medication management   43yo with Schizophrenia, and alcohol and cannabis use disorder, presented with increased psychosis, likely triggered by heavy alcohol and cannabis use.  Continue IVC for HI, and paranoia.   # Schizophrenia, Paranoid type -- continue haldol decanoate 200mg  q3 week, last injection was given on 05/23/2018, verified by his ACT, [facility] [place].  -- restart seroquel 200mg  qhs.  Pt has not been compliant with it.  -- continue trazodone 100mg  qhs prn.  -- provide haldol,  ativan and benadryl PRN for agitation with IM backup.  -- EKG is pending.   # Alcohol use disorder and Cananbis use disorder -- will provide Librium PRN for alcohol with Sx.  The risk of complicated withdrawal is low.  -- recommend substance abuse treatment.   # Disposition. -- Coordinate with [facility] [place] at [phone] or [phone] (crisis line).  -- Coordinate with his parents at [phone] -- defer to primary Team.   Observation Level/Precautions:  1 to 1  Laboratory:  CBC Chemistry Profile Folic Acid  Psychotherapy:    Medications:    Consultations:    Discharge Concerns:    Estimated LOS:  Other:     Physician Treatment Plan for Primary Diagnosis: <principal problem not specified> Long Term Goal(s): Improvement in symptoms so as ready  for discharge  Short Term Goals: Ability to identify changes in lifestyle to reduce recurrence of condition will improve  Physician Treatment Plan for Secondary Diagnosis: Active Problems:   Schizophrenia (HCC)  Long Term Goal(s): Improvement in symptoms so as ready for discharge  Short Term Goals: Ability to identify changes in lifestyle to reduce recurrence of condition will improve  I certify that inpatient services furnished can reasonably be expected to improve the patient's condition.    Rafeal Skibicki, MD 4/18/20202:33 PM

## 2018-06-09 NOTE — ED Notes (Signed)
Patient resting quietly in room. No noted distress or abnormal behaviors noted. Will continue 15 minute checks and observation by security camera for safety. 

## 2018-06-09 NOTE — Plan of Care (Signed)
Pt. Is complaint with medications and unit procedures. Pt. Denies si/hi/avh, able to contract for safety.    Problem: Health Behavior/Discharge Planning: Goal: Compliance with treatment plan for underlying cause of condition will improve Outcome: Progressing     

## 2018-06-09 NOTE — ED Notes (Signed)
Pt discharged to BMU under IVC. VS stable. All belongings will be sent with patient. Report given to [place], Charity fundraiser.
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
## 2018-06-10 LAB — TSH: TSH: 2.668 u[IU]/mL (ref 0.350–4.500)
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
## 2018-06-10 NOTE — Plan of Care (Signed)
Pt. Is complaint with unit procedures and medications. Pt. Interactions with staff and peers appropriate. Pt. Denies si/hi/avh, able to contract for safety.    Problem: Health Behavior/Discharge Planning: Goal: Compliance with treatment plan for underlying cause of condition will improve Outcome: Progressing   Problem: Coping: Goal: Ability to interact with others will improve Outcome: Progressing
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
## 2018-06-10 NOTE — B[facility] Group Notes (Signed)
LCSW Group Therapy Note 06/10/2018 1:15pm  Type of Therapy and Topic: Group Therapy: Feelings Around Returning Home & Establishing a Supportive Framework and Supporting Oneself When Supports Not Available  Participation Level: Did Not Attend  Description of Group:  Patients first processed thoughts and feelings about upcoming discharge. These included fears of upcoming changes, lack of change, new living environments, judgements and expectations from others and overall stigma of mental health issues. The group then discussed the definition of a supportive framework, what that looks and feels like, and how do to discern it from an unhealthy non-supportive network. The group identified different types of supports as well as what to do when your family/friends are less than helpful or unavailable  Therapeutic Goals  1. Patient will identify one healthy supportive network that they can use at discharge. 2. Patient will identify one factor of a supportive framework and how to tell it from an unhealthy network. 3. Patient able to identify one coping skill to use when they do not have positive supports from others. 4. Patient will demonstrate ability to communicate their needs through discussion and/or role plays.  Summary of Patient Progress:  Pt was invited to attend group but chose not to attend. CSW will continue to encourage pt to attend group throughout their admission.    Therapeutic Modalities Cognitive Behavioral Therapy Motivational Interviewing   Lylia Karn  CUEBAS-COLON, LCSW 06/10/2018 12:26 PM

## 2018-06-10 NOTE — Plan of Care (Signed)
Patient is alert and oriented X 3, denies SI, HI and AVH. Patient has been cooperative  and pleasant this morning. Patient eating well, sleeping well, denies having pain 0/10. Patient LBM 06/09/2018. Patient's vitals have been within normal limits. Patient denies nausea, vomiting; shakiness; tremors and body aches. Patient denies cough, and sore throat. Safety checks Q 15 minutes to continue . Problem: Education: Goal: Knowledge of [facility] Education information/materials will improve Outcome: Progressing   Problem: Coping: Goal: Ability to identify and develop effective coping behavior will improve Outcome: Progressing Goal: Ability to interact with others will improve Outcome: Progressing Goal: Ability to use eye contact when communicating with others will improve Outcome: Progressing   Problem: Self-Concept: Goal: Will verbalize positive feelings about self Outcome: Progressing

## 2018-06-10 NOTE — BHH Suicide Risk Assessment (Signed)
[facility] INPATIENT:  Family/Significant Other Suicide Prevention Education  Suicide Prevention Education:  Patient Refusal for Family/Significant Other Suicide Prevention Education: The patient Larry Walter has refused to provide written consent for family/significant other to be provided Family/Significant Other Suicide Prevention Education during admission and/or prior to discharge.  Physician notified.  Marquee Fuchs  CUEBAS-COLON 06/10/2018, 3:56 PM

## 2018-06-10 NOTE — Progress Notes (Signed)
D: Pt during assessments denies SI/HI/AVH, able to contract for safety. Pt is minimal with engagement, but cooperative and mostly pleasant. Pt. Endorses a normal mood.   A: Q x 15 minute observation checks were completed for safety. Patient was provided with education. Patient was given/offered medications per orders. Patient  was encourage to attend groups, participate in unit activities and continue with plan of care. Pt. Chart and plans of care reviewed. Pt. Given support and encouragement.   R: Patient is complaint with medication and unit procedures. Pt. covid screened. Pt. Interactions with staff and peers appropriate. Pt. Attends snack time, eating good. No behavioral issues to report. No s/s of withdrawel observed.              Precautionary checks every 15 minutes for safety maintained, room free of safety hazards, patient sustains no injury or falls during this shift. Will endorse care to next shift.

## 2018-06-11 DIAGNOSIS — F203 Undifferentiated schizophrenia: Secondary | ICD-10-CM

## 2018-06-11 MED ORDER — HALOPERIDOL DECANOATE 100 MG/ML IM SOLN
300.0000 mg | Freq: Once | INTRAMUSCULAR | Status: AC
  Administered 2018-06-11: 300 mg via INTRAMUSCULAR
  Filled 2018-06-11: qty 3

## 2018-06-11 MED ORDER — HALOPERIDOL DECANOATE 100 MG/ML IM SOLN
200.0000 mg | Freq: Once | INTRAMUSCULAR | Status: DC
  Filled 2018-06-11: qty 2

## 2018-06-11 NOTE — BHH Counselor (Signed)
CSW spoke with the crisis worker Harriet with [employer].  Berton Mount was the crisis worker who IVC'ed the patient.    Berton Mount reports that the patient medications were increased "but didn't seem to be working".  Berton Mount reports concerns that the patient "was using drugs while taking medications and at this time his body just needs to detox".  Berton Mount reports "this time he stated that he wanted to harm his parents and kill them.  He was delusional too.  It looked like he was overwhelmed with information. He was going off on tangents on being the [place] of the world, the world was going to end and he was going to be the only human living."  Berton Mount reports that the patient would like to live independently but currently living with his parents.    Berton Mount reports that the patient has been anxious and manic since February.    Berton Mount reports concerns that the patient "knows what to say to appear calm".    Harriet further reports that the patient's parents are not willing to allow the patient to return home at this time.  CSW obtained the correct phone number for the patient's parents.  Harriet provided the number for Dr. Alla German, the ACT team psychiatrist, [phone] and Heart the ACT Team Lead, [phone].  Penni Homans, MSW, LCSW 06/11/2018 10:29 AM

## 2018-06-11 NOTE — B[facility] Group Notes (Signed)
[facility] Group Notes:  (Nursing/MHT/Case Management/Adjunct)  Date:  06/11/2018  Time:  5:28 PM  Type of Therapy:  Psychoeducational Skills  Participation Level:  Active  Participation Quality:  Appropriate, Sharing and Supportive  Affect:  Appropriate  Cognitive:  Appropriate and Oriented  Insight:  Good  Engagement in Group:  Engaged  Modes of Intervention:  Discussion, Exploration and Support  Summary of Progress/Problems:  Larry Walter 06/11/2018, 5:28 PM

## 2018-06-11 NOTE — BHH Group Notes (Signed)
[facility] Group Notes:  (Nursing/MHT/Case Management/Adjunct)  Date:  06/11/2018  Time:  8:33 PM  Type of Therapy:  Group Therapy  Participation Level:  Active  Participation Quality:  Appropriate and Attentive  Affect:  Appropriate  Cognitive:  Appropriate  Insight:  Good  Engagement in Group:  Engaged and Improving  Modes of Intervention:  Education  Summary of Progress/Problems:  Larry Walter 06/11/2018, 8:33 PM

## 2018-06-11 NOTE — Progress Notes (Signed)
Patient ID: Larry Walter, male   DOB: [birth year], 43 y.o.   MRN: [id number] Spoke with his act team physician today.  Pact team has been trying to get the patient on 300 mg a month of Haldol decanoate as they have found that that medicine seems to have done the best for him and been reasonably well tolerated.  I will go ahead and up the order of his Haldol to 300 mg IM.  Reviewed with nursing.  If possible it can be given in 2 different injection sites to decrease soreness.

## 2018-06-11 NOTE — Progress Notes (Signed)
Recreation Therapy Notes  Date: 06/11/2018  Time: 9:30 am   Location: Craft room   Behavioral response: N/A   Intervention Topic: Goals  Discussion/Intervention: Patient did not attend group.   Clinical Observations/Feedback:  Patient did not attend group.   Yohana Bartha LRT/CTRS         [name] 06/11/2018 10:54 AM

## 2018-06-11 NOTE — Progress Notes (Signed)
Recreation Therapy Notes  INPATIENT RECREATION THERAPY ASSESSMENT  Patient Details Name: Larry Walter MRN: [id number] DOB: 03-Oct-1975 Today's Date: 06/11/2018       Information Obtained From: Patient  Able to Participate in Assessment/Interview: Yes  Patient Presentation: Responsive  Reason for Admission (Per Patient): Active Symptoms, Other (Comments)(Lies, Attitude)  Patient Stressors:    Coping Skills:   TV, Music, Substance Abuse, Other (Comment)(Drive)  Leisure Interests (2+):  (Smoke weed, [place] girls)  Frequency of Recreation/Participation: Weekly  Awareness of Community Resources:     [employer]:     Current Use:    If no, Barriers?:    Expressed Interest in [employer] Information:    [place]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

## 2018-06-11 NOTE — B[facility] Group Notes (Signed)
LCSW Group Therapy Note   06/11/2018 12:42 PM   Type of Therapy and Topic:  Group Therapy:  Overcoming Obstacles   Participation Level:  Minimal   Description of Group:    In this group patients will be encouraged to explore what they see as obstacles to their own wellness and recovery. They will be guided to discuss their thoughts, feelings, and behaviors related to these obstacles. The group will process together ways to cope with barriers, with attention given to specific choices patients can make. Each patient will be challenged to identify changes they are motivated to make in order to overcome their obstacles. This group will be process-oriented, with patients participating in exploration of their own experiences as well as giving and receiving support and challenge from other group members.   Therapeutic Goals: 1. Patient will identify personal and current obstacles as they relate to admission. 2. Patient will identify barriers that currently interfere with their wellness or overcoming obstacles.  3. Patient will identify feelings, thought process and behaviors related to these barriers. 4. Patient will identify two changes they are willing to make to overcome these obstacles:      Summary of Patient Progress Pt came to group and discussed an obstacle for him as his medical issues. Pt did not engage in the rest of the group discussion and ended up leaving group and coming back towards the end, but did not participate in the discussion.      Therapeutic Modalities:   Cognitive Behavioral Therapy Solution Focused Therapy Motivational Interviewing Relapse Prevention Therapy  Iris Pert, MSW, LCSW Clinical Social Work 06/11/2018 12:42 PM

## 2018-06-11 NOTE — Progress Notes (Signed)
Patient's affect is brighter and interacting more with staff & peers.Patient had Haldol shots in both arms as a divided dose.

## 2018-06-11 NOTE — Progress Notes (Signed)
D: Pt during assessments denies SI/HI/AVH, able to contract for safety. Pt is pleasant and cooperative, engages good. Pt. reports he is discharge ready. Pt. Endorses a normal mood. No bizarre behavior to report.   A: Q x 15 minute observation checks were completed for safety. Patient was provided with education. Patient was given/offered medications per orders. Patient  was encourage to attend groups, participate in unit activities and continue with plan of care. Pt. Chart and plans of care reviewed. Pt. Given support and encouragement.   R: Patient is complaint with medication and unit procedures. Pt. covid screened. Pt. Denies any side effects from long acting IM medication given earlier in the day. Pt. Attends snack time, eating good. Pt. Goes to group.             Precautionary checks every 15 minutes for safety maintained, room free of safety hazards, patient sustains no injury or falls during this shift. Will endorse care to next shift.

## 2018-06-11 NOTE — Plan of Care (Signed)
Pt. Is pleasant and cooperative with staff and peers, interacting well. Pt. Is complaint with medications and unit procedures. Pt. Attends snack time and groups appropriate.    Problem: Health Behavior/Discharge Planning: Goal: Compliance with treatment plan for underlying cause of condition will improve Outcome: Progressing   Problem: Coping: Goal: Ability to interact with others will improve Outcome: Progressing

## 2018-06-11 NOTE — Tx Team (Addendum)
Interdisciplinary Treatment and Diagnostic Plan Update  06/11/2018 Time of Session: 2:30PM Larry Walter MRN: [id number]  Principal Diagnosis: Schizophrenia [facility])  Secondary Diagnoses: Principal Problem:   Schizophrenia (HCC) Active Problems:   Marijuana abuse, continuous   Alcohol use disorder, severe, dependence (HCC)   Current Medications:  Current Facility-Administered Medications  Medication Dose Route Frequency Provider Last Rate Last Dose  . acetaminophen (TYLENOL) tablet 650 mg  650 mg Oral Q6H PRN Luciano Cutter, DO      . alum & mag hydroxide-simeth (MAALOX/MYLANTA) 200-200-20 MG/5ML suspension 30 mL  30 mL Oral Q4H PRN Luciano Cutter, DO      . chlordiazePOXIDE (LIBRIUM) capsule 25 mg  25 mg Oral TID PRN He, Jun, MD      . diphenhydrAMINE (BENADRYL) capsule 50 mg  50 mg Oral Q6H PRN He, Jun, MD       Or  . diphenhydrAMINE (BENADRYL) injection 25 mg  25 mg Intramuscular Q6H PRN He, Jun, MD      . haloperidol (HALDOL) tablet 5 mg  5 mg Oral Q6H PRN He, Jun, MD       Or  . haloperidol lactate (HALDOL) injection 5 mg  5 mg Intramuscular Q6H PRN He, Jun, MD      . haloperidol decanoate (HALDOL DECANOATE) 100 MG/ML injection 300 mg  300 mg Intramuscular Once Clapacs, John T, MD      . LORazepam (ATIVAN) tablet 2 mg  2 mg Oral Q6H PRN Luciano Cutter, DO       Or  . LORazepam (ATIVAN) injection 2 mg  2 mg Intramuscular Q6H PRN Donata Clay R, DO      . magnesium hydroxide (MILK OF MAGNESIA) suspension 30 mL  30 mL Oral Daily PRN Luciano Cutter, DO      . QUEtiapine (SEROQUEL) tablet 200 mg  200 mg Oral QHS He, Jun, MD   200 mg at 06/10/18 2130  . traZODone (DESYREL) tablet 100 mg  100 mg Oral QHS PRN He, Jun, MD       PTA Medications: Medications Prior to Admission  Medication Sig Dispense Refill Last Dose  . gabapentin (NEURONTIN) 300 MG capsule Take 300 mg by mouth 3 (three) times daily.   Past Month at Unknown time  . haloperidol (HALDOL) 10 MG tablet Take 1  tablet (10 mg total) by mouth 2 (two) times daily. (Patient taking differently: Take 10 mg by mouth 3 (three) times daily. ) 60 tablet 0 Past Month at Unknown time  . haloperidol decanoate (HALDOL DECANOATE) 100 MG/ML injection Inject 2 mLs (200 mg total) into the muscle every 28 (twenty-eight) days. 2 mL 1 Past Month at Unknown time  . QUEtiapine (SEROQUEL) 200 MG tablet Take 200 mg by mouth daily.   Past Month at Unknown time  . traZODone (DESYREL) 100 MG tablet Take 1 tablet (100 mg total) by mouth at bedtime as needed for sleep. 30 tablet 0 Past Month at Unknown time    Patient Stressors: Medication change or noncompliance Substance abuse  Patient Strengths: Average or above average intelligence Communication skills Physical Health  Treatment Modalities: Medication Management, Group therapy, Case management,  1 to 1 session with clinician, Psychoeducation, Recreational therapy.   Physician Treatment Plan for Primary Diagnosis: Schizophrenia [facility]) Long Term Goal(s): Improvement in symptoms so as ready for discharge Improvement in symptoms so as ready for discharge   Short Term Goals: Ability to identify changes in lifestyle to reduce recurrence of condition will improve  Ability to identify changes in lifestyle to reduce recurrence of condition will improve  Medication Management: Evaluate patient's response, side effects, and tolerance of medication regimen.  Therapeutic Interventions: 1 to 1 sessions, Unit Group sessions and Medication administration.  Evaluation of Outcomes: Progressing  Physician Treatment Plan for Secondary Diagnosis: Principal Problem:   Schizophrenia (HCC) Active Problems:   Marijuana abuse, continuous   Alcohol use disorder, severe, dependence (HCC)  Long Term Goal(s): Improvement in symptoms so as ready for discharge Improvement in symptoms so as ready for discharge   Short Term Goals: Ability to identify changes in lifestyle to reduce recurrence of  condition will improve Ability to identify changes in lifestyle to reduce recurrence of condition will improve     Medication Management: Evaluate patient's response, side effects, and tolerance of medication regimen.  Therapeutic Interventions: 1 to 1 sessions, Unit Group sessions and Medication administration.  Evaluation of Outcomes: Progressing   RN Treatment Plan for Primary Diagnosis: Schizophrenia (HCC) Long Term Goal(s): Knowledge of disease and therapeutic regimen to maintain health will improve  Short Term Goals: Ability to verbalize frustration and anger appropriately will improve, Ability to demonstrate self-control, Ability to participate in decision making will improve, Ability to verbalize feelings will improve and Compliance with prescribed medications will improve  Medication Management: RN will administer medications as ordered by provider, will assess and evaluate patient's response and provide education to patient for prescribed medication. RN will report any adverse and/or side effects to prescribing provider.  Therapeutic Interventions: 1 on 1 counseling sessions, Psychoeducation, Medication administration, Evaluate responses to treatment, Monitor vital signs and CBGs as ordered, Perform/monitor CIWA, COWS, AIMS and Fall Risk screenings as ordered, Perform wound care treatments as ordered.  Evaluation of Outcomes: Progressing   LCSW Treatment Plan for Primary Diagnosis: Schizophrenia (HCC) Long Term Goal(s): Safe transition to appropriate next level of care at discharge, Engage patient in therapeutic group addressing interpersonal concerns.  Short Term Goals: Engage patient in aftercare planning with referrals and resources, Increase social support, Increase ability to appropriately verbalize feelings, Increase emotional regulation and Identify triggers associated with mental health/substance abuse issues  Therapeutic Interventions: Assess for all discharge needs, 1  to 1 time with Social worker, Explore available resources and support systems, Assess for adequacy in community support network, Educate family and significant other(s) on suicide prevention, Complete Psychosocial Assessment, Interpersonal group therapy.  Evaluation of Outcomes: Progressing   Progress in Treatment: Attending groups: Yes. Participating in groups: No. Taking medication as prescribed: Yes. Toleration medication: Yes. Family/Significant other contact made: No, will contact:  SPE attempts have been made. Patient understands diagnosis: Yes. Discussing patient identified problems/goals with staff: Yes. Medical problems stabilized or resolved: Yes. Denies suicidal/homicidal ideation: Yes. Issues/concerns per patient self-inventory: No. Other: none  New problem(s) identified: No, Describe:  none  New Short Term/Long Term Goal(s): detox, elimination of symptoms of psychosis, medication management for mood stabilization; elimination of SI thoughts; development of comprehensive mental wellness/sobriety plan.  Patient Goals:   "Learn how to control my anger"  Discharge Plan or Barriers: At this time it is unclear if the patient is allowed to return to his parents home. CSW has attempted to contact the family but has not been able to reach anyone.  [facility] reports that the patient will be able to continue to utilize their services.   Reason for Continuation of Hospitalization: Aggression Anxiety Homicidal ideation Medical Issues Medication stabilization  Estimated Length of Stay:  Recreational Therapy: Patient Stressors: N/A Patient Goal: Patient  will engage in groups without prompting or encouragement from LRT x3 group sessions within 5 recreation therapy group sessions  Attendees: Patient: Larry Walter 06/11/2018 3:02 PM  Physician: Mordecai Rasmussen, MD 06/11/2018 3:02 PM  Nursing: Doyce Para, RN 06/11/2018 3:02 PM  RN Care Manager: 06/11/2018 3:02 PM  Social  Worker: Penni Homans, MSW, LCSW 06/11/2018 3:02 PM  Recreational Therapist: Garret Reddish, Drue Flirt, LRT 06/11/2018 3:02 PM  Other: Lowella Dandy, MSW, LCSW 06/11/2018 3:02 PM  Other: Iris Pert, MSW, LCSW 06/11/2018 3:02 PM  Other: 06/11/2018 3:02 PM    Scribe for Treatment Team: Harden Mo, LCSW 06/11/2018 3:02 PM

## 2018-06-11 NOTE — Progress Notes (Signed)
Patient ID: Larry Walter, male   DOB: 12/03/1975, 43 y.o.   MRN: [id number]   CSW called [facility] to discuss the patient.  CSW was informed that the team was entering a meeting and will call the CSW in 30 minutes.    Penni Homans, MSW, LCSW 06/11/2018 9:18 AM
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
## 2018-06-11 NOTE — Plan of Care (Signed)
Patient is guarded and visible in the milieu with blunted affect.Stated that he is feeling better.Denies SI,HI and AVH.Patient stated " there is nothing new in the groups."Patient attended groups partially.Compliant with medications.Appetite and energy level good.Support and encouragement given.

## 2018-06-11 NOTE — [facility] Suicide Risk Assessment (Signed)
[facility] INPATIENT:  Family/Significant Other Suicide Prevention Education  Suicide Prevention Education:  Contact Attempts: Stanton Kidney and Merwin Bourgault, family, [phone] has been identified by the patient as the family member/significant other with whom the patient will be residing, and identified as the person(s) who will aid the patient in the event of a mental health crisis.  With written consent from the patient, two attempts were made to provide suicide prevention education, prior to and/or following the patient's discharge.  We were unsuccessful in providing suicide prevention education.  A suicide education pamphlet was given to the patient to share with family/significant other.  Date and time of first attempt: 06/11/18 9:12AM Date and time of second attempt: Second attempt is needed.  CSW attempted to call the patient's family at the patient's request.  CSW received a message that call could not be completed as dialed.  CSW checked the number dialed and the information given and confirmed it was the same. CSW will try again at later date/time.  Harden Mo 06/11/2018, 9:12 AM

## 2018-06-12 NOTE — Plan of Care (Signed)
Patient is appropriate in the unit.Denies SI,HI and AVH.Compliant with medications.Attended some groups.Patient stated that he enjoyed outside at the courtyard.Appetite and energy level good.Support and encouragement given.

## 2018-06-12 NOTE — Progress Notes (Signed)
[facility]  06/12/2018 2:01 PM Larry OverallKenneth Wayne Walter  MRN:  [id number] Subjective: Follow-up for this patient with schizophrenia.  Patient got 300 mg of IM haloperidol decanoate yesterday.  He is having no akathisia no stiffness no dystonia.  No psychiatric complaints.  Patient was calm and appropriate today.  He interacts well with peers and staff without cursing or getting hospital.  Taking care of his ADLs without difficulty.  We learned from his family that they are refusing to allow him to come home but the patient thinks he has a cousin in the area with whom he can stay. Principal Problem: Schizophrenia (HCC) Diagnosis: Principal Problem:   Schizophrenia (HCC) Active Problems:   Marijuana abuse, continuous   Alcohol use disorder, severe, dependence (HCC)  Total Time spent with patient: 30 minutes  Past Psychiatric History: Schizophrenia going back years.  History of agitation when he is off his medicine especially when he has conflict with his family of origin.  Past Medical History:  Past Medical History:  Diagnosis Date  . Anxiety   . Bipolar disorder (HCC)   . Depression   . Heart murmur   . Hypertension    History reviewed. No pertinent surgical history. Family History: History reviewed. No pertinent family history. Family Psychiatric  History: See previous Social History:  Social History   Substance and Sexual Activity  Alcohol Use Yes     Social History   Substance and Sexual Activity  Drug Use Yes  . Types: Marijuana    Social History   Socioeconomic History  . Marital status: Single    Spouse name: Not on file  . Number of children: Not on file  . Years of education: Not on file  . Highest education level: Not on file  Occupational History  . Not on file  Social Needs  . Financial resource strain: Not on file  . Food insecurity:    Worry: Not on file    Inability: Not on file  . Transportation needs:    Medical: Not on file     Non-medical: Not on file  Tobacco Use  . Smoking status: Current Every Day Smoker  . Smokeless tobacco: Former Engineer, waterUser  Substance and Sexual Activity  . Alcohol use: Yes  . Drug use: Yes    Types: Marijuana  . Sexual activity: Not on file  Lifestyle  . Physical activity:    Days per week: Not on file    Minutes per session: Not on file  . Stress: Not on file  Relationships  . Social connections:    Talks on phone: Not on file    Gets together: Not on file    Attends religious service: Not on file    Active member of club or organization: Not on file    Attends meetings of clubs or organizations: Not on file    Relationship status: Not on file  Other Topics Concern  . Not on file  Social History Narrative  . Not on file   Additional Social History:                         Sleep: Fair  Appetite:  Fair  Current Medications: Current Facility-Administered Medications  Medication Dose Route Frequency Provider Last Rate Last Dose  . acetaminophen (TYLENOL) tablet 650 mg  650 mg Oral Q6H PRN Donata ClaySmith, Justin R, DO      . alum & mag hydroxide-simeth (MAALOX/MYLANTA) 200-200-20 MG/5ML suspension 30  mL  30 mL Oral Q4H PRN Luciano Cutter, DO      . chlordiazePOXIDE (LIBRIUM) capsule 25 mg  25 mg Oral TID PRN He, Jun, MD      . diphenhydrAMINE (BENADRYL) capsule 50 mg  50 mg Oral Q6H PRN He, Jun, MD       Or  . diphenhydrAMINE (BENADRYL) injection 25 mg  25 mg Intramuscular Q6H PRN He, Jun, MD      . haloperidol (HALDOL) tablet 5 mg  5 mg Oral Q6H PRN He, Jun, MD       Or  . haloperidol lactate (HALDOL) injection 5 mg  5 mg Intramuscular Q6H PRN He, Jun, MD      . LORazepam (ATIVAN) tablet 2 mg  2 mg Oral Q6H PRN Luciano Cutter, DO       Or  . LORazepam (ATIVAN) injection 2 mg  2 mg Intramuscular Q6H PRN Donata Clay R, DO      . magnesium hydroxide (MILK OF MAGNESIA) suspension 30 mL  30 mL Oral Daily PRN Luciano Cutter, DO      . QUEtiapine (SEROQUEL) tablet 200 mg  200  mg Oral QHS He, Jun, MD   200 mg at 06/11/18 2107  . traZODone (DESYREL) tablet 100 mg  100 mg Oral QHS PRN He, Jun, MD        Lab Results: No results found for this or any previous visit (from the past 48 hour(s)).  Blood Alcohol level:  Lab Results  Component Value Date   ETH <10 06/08/2018   ETH 109 (H) 04/26/2018    Metabolic Disorder Labs: No results found for: HGBA1C, MPG No results found for: PROLACTIN No results found for: CHOL, TRIG, HDL, CHOLHDL, VLDL, LDLCALC  Physical Findings: AIMS:  , ,  ,  ,    CIWA:  CIWA-Ar Total: 0 COWS:     Musculoskeletal: Strength & Muscle Tone: within normal limits Gait & Station: normal Patient leans: N/A  Psychiatric Specialty Exam: Physical Exam  Nursing note and vitals reviewed. Constitutional: He appears well-developed and well-nourished.  HENT:  Head: Normocephalic and atraumatic.  Eyes: Pupils are equal, round, and reactive to light. Conjunctivae are normal.  Neck: Normal range of motion.  Cardiovascular: Regular rhythm and normal heart sounds.  Respiratory: Effort normal. No respiratory distress.  GI: Soft.  Musculoskeletal: Normal range of motion.  Neurological: He is alert.  Skin: Skin is warm and dry.  Psychiatric: He has a normal mood and affect. His behavior is normal. Judgment and thought content normal.    Review of Systems  Constitutional: Negative.   HENT: Negative.   Eyes: Negative.   Respiratory: Negative.   Cardiovascular: Negative.   Gastrointestinal: Negative.   Musculoskeletal: Negative.   Skin: Negative.   Neurological: Negative.   Psychiatric/Behavioral: Negative.     Blood pressure 117/89, pulse 77, temperature 99.2 F (37.3 C), temperature source Oral, resp. rate 17, height  (1.88 m), weight 85.7 kg, SpO2 99 %.Body mass index is 24.27 kg/m.  General Appearance: Fairly Groomed  Eye Contact:  Good  Speech:  Clear and Coherent  Volume:  Normal  Mood:  Euthymic  Affect:  Congruent   Thought Process:  Goal Directed  Orientation:  Full (Time, Place, and Person)  Thought Content:  Logical  Suicidal Thoughts:  No  Homicidal Thoughts:  No  Memory:  Immediate;   Fair Recent;   Fair Remote;   Fair  Judgement:  Fair  Insight:  Fair  Psychomotor Activity:  Normal  Concentration:  Concentration: Fair  Recall:  [employer] of Knowledge:  Fair  Language:  Fair  Akathisia:  No  Handed:  Right  AIMS (if indicated):     Assets:  Desire for Improvement Physical Health Resilience  ADL's:  Intact  Cognition:  WNL  Sleep:  Number of Hours: 6.5     Treatment Plan Summary: Daily contact with patient to assess and evaluate symptoms and progress in treatment, Medication management and Plan Patient seems to be stabilizing.  Not threatening not aggressive not grossly bizarre here.  Able to make decisions it appears to be able to care for himself.  No change to current medicine orders.  Anticipate likely discharge in 1 to 2 days if we can confirm appropriate disposition plans.  He is agreeable to following up with his act team.  Mordecai Rasmussen, MD 06/12/2018, 2:01 PM

## 2018-06-12 NOTE — [facility] Group Notes (Signed)
[facility] Group Notes:  (Nursing/MHT/Case Management/Adjunct)  Date:  06/12/2018  Time:  11:23 PM  Type of Therapy:  Group Therapy  Participation Level:  Did Not Attend   Larry Walter L Tameia Rafferty 06/12/2018, 11:23 PM 

## 2018-06-12 NOTE — [facility] Group Notes (Signed)
LCSW Group Therapy Note  06/12/2018 1:00 PM  Type of Therapy/Topic:  Group Therapy:  Feelings about Diagnosis  Participation Level:  Did Not Attend   Description of Group:   This group will allow patients to explore their thoughts and feelings about diagnoses they have received. Patients will be guided to explore their level of understanding and acceptance of these diagnoses. Facilitator will encourage patients to process their thoughts and feelings about the reactions of others to their diagnosis and will guide patients in identifying ways to discuss their diagnosis with significant others in their lives. This group will be process-oriented, with patients participating in exploration of their own experiences, giving and receiving support, and processing challenge from other group members.   Therapeutic Goals: 1. Patient will demonstrate understanding of diagnosis as evidenced by identifying two or more symptoms of the disorder 2. Patient will be able to express two feelings regarding the diagnosis 3. Patient will demonstrate their ability to communicate their needs through discussion and/or role play  Summary of Patient Progress: X     Therapeutic Modalities:   Cognitive Behavioral Therapy Brief Therapy Feelings Identification   Penni Homans, MSW, LCSW 06/12/2018 2:25 PM
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
## 2018-06-12 NOTE — Progress Notes (Signed)
Recreation Therapy Notes  Date: 06/12/2018  Time: 9:30 am  Location: Craft Room  Behavioral response: Appropriate  Intervention Topic: Decision Making  Discussion/Intervention:  Group content today was focused on Decision making. The group defined decision making and some positive ways they make decisions for themselves. Individuals expressed reasons why they neglected any decision making in the past. Patients described ways to improve decision making skills in the future. The group explained what could happen if they did not do any decision making at all. Participants express how bad decision has affected them and others around them. Individual explained the importance of decision making. The group participated in the intervention "Making decisions" where they had a chance to discover some of their weaknesses and strengths in decision making. Patient came up with a new decision-making skill to improve themselves in the future.  Clinical Observations/Feedback:  Patient came to group late due to unknown reasons and sat in a corner. Zaheer Wageman LRT/CTRS         Alisah Grandberry 06/12/2018 10:38 AM

## 2018-06-12 NOTE — [facility] Suicide Risk Assessment (Signed)
[facility] INPATIENT:  Family/Significant Other Suicide Prevention Education  Suicide Prevention Education:  Education Completed; Algis and Apolinar Junes, [phone], father has been identified by the patient as the family member/significant other with whom the patient will be residing, and identified as the person(s) who will aid the patient in the event of a mental health crisis (suicidal ideations/suicide attempt).  With written consent from the patient, the family member/significant other has been provided the following suicide prevention education, prior to the and/or following the discharge of the patient.  The suicide prevention education provided includes the following:  Suicide risk factors  Suicide prevention and interventions  National Suicide Hotline telephone number  [facility] assessment telephone number  [facility] Emergency Assistance 911  [facility] and/or Residential Mobile Crisis Unit telephone number  Request made of family/significant other to:  Remove weapons (e.g., guns, rifles, knives), all items previously/currently identified as safety concern.    Remove drugs/medications (over-the-counter, prescriptions, illicit drugs), all items previously/currently identified as a safety concern.  The family member/significant other verbalizes understanding of the suicide prevention education information provided.  The family member/significant other agrees to remove the items of safety concern listed above.  Father reports that there are no weapons in the home. Father reports that the patient likely became irritated with the children in the home.  CSW asked several times if the patient was allowed to return to the home and the parents appeared to avoid the conversation.  Father later stated that they wanted to speak with the ACT team and would call this CSW back.   Harden Mo 06/12/2018, 9:51 AM

## 2018-06-13 MED ORDER — QUETIAPINE FUMARATE 200 MG PO TABS: 200.0000 mg | ORAL_TABLET | Freq: Every day | ORAL | 1 refills | Status: AC

## 2018-06-13 MED ORDER — HALOPERIDOL DECANOATE 100 MG/ML IM SOLN: 300.0000 mg | INTRAMUSCULAR | 1 refills | Status: AC

## 2018-06-13 NOTE — Plan of Care (Signed)
  Problem: Self-Concept: Goal: Will verbalize positive feelings about self Outcome: Progressing  Patient verbalized feelings to staff.  

## 2018-06-13 NOTE — [facility] Group Notes (Signed)
Emotional Regulation 06/13/2018 1PM  Type of Therapy/Topic:  Group Therapy:  Emotion Regulation  Participation Level:  Did Not Attend   Description of Group:   The purpose of this group is to assist patients in learning to regulate negative emotions and experience positive emotions. Patients will be guided to discuss ways in which they have been vulnerable to their negative emotions. These vulnerabilities will be juxtaposed with experiences of positive emotions or situations, and patients will be challenged to use positive emotions to combat negative ones. Special emphasis will be placed on coping with negative emotions in conflict situations, and patients will process healthy conflict resolution skills.  Therapeutic Goals: 1. Patient will identify two positive emotions or experiences to reflect on in order to balance out negative emotions 2. Patient will label two or more emotions that they find the most difficult to experience 3. Patient will demonstrate positive conflict resolution skills through discussion and/or role plays  Summary of Patient Progress:       Therapeutic Modalities:   Cognitive Behavioral Therapy Feelings Identification Dialectical Behavioral Therapy   Zuma Hust T Khairi Garman, LCSW 06/13/2018 2:03 PM  

## 2018-06-13 NOTE — Progress Notes (Signed)
Patient ID: Larry Walter, male   DOB: 1975-07-08, 43 y.o.   MRN: [id number] Psychiatric update: After what had first seemed like a straightforward plan for discharge, there were some difficulties that arose.  [name] is not able to do any transportation currently because of the coronavirus.  Patient does not know an address for the cousin or other relation with whom he hopes to stay.  Instead he would like to go back to his mother and father's house to pick up his truck.  He became agitated belligerent and was starting to curse and make vague nonspecific threats and raise his voice.  He did not make any psychotic statements and it seemed more like a personality irritability thing than a reflection of a specific psychosis.  To try and find some solution I called the contact phone number in the chart which is listed as the home of his father.  I spoke to a woman there who identified herself as his sister.  She said that the patient's parents were not at home but that she felt certain that it would be acceptable for the patient to come by and just pick up his truck and belongings and then move on.  Did not feel like this was a dangerous or frightening situation.  I advised her therefore that the patient would be getting discharged today and they could probably expect to see him coming by for his belongings this afternoon.  She seemed to be fine with that.  Patient is not currently expressing any specific threats towards any specific person.  Clinically it appears that he is unlikely to get more benefit from hospitalization and is probably more likely to just escalate and become more of a nuisance or even threat to other agitated patients.  All and all the safest and right thing appears to be to proceed with the discharge.  Nursing and social work aware of the plan.

## 2018-06-13 NOTE — Progress Notes (Signed)
Patient alert and oriented x 4, affect is flat but he brightens upon approach. Patient denies SI/HI/AVH, he isolated to his room most of the shift except during snack time. Patient's thought are organized and coherent, he appears less anxious, speech is not pressured, no distress noted, 15 minutes safety checks maintained will continue to monitor.

## 2018-06-13 NOTE — Progress Notes (Signed)
  [facility] Adult Case Management Discharge Plan :  Will you be returning to the same living situation after discharge:  No. At discharge, do you have transportation home?: Yes,  CSW will provide cab voucher. Do you have the ability to pay for your medications: Yes,  Medicare  Release of information consent forms completed and in the chart;  Patient's signature needed at discharge.  Patient to Follow up at: Follow-up Information    [facility] Follow up.   Why:  Please follow up with your ACT team upon dishcarged.  Contact information: [address] [place] [address] [phone]           Next level of care provider has access to [facility] Link:no  Safety Planning and Suicide Prevention discussed: Yes,  SPE complete with pt parents  Have you used any form of tobacco in the last 30 days? (Cigarettes, Smokeless Tobacco, Cigars, and/or Pipes): Yes  Has patient been referred to the Quitline?: Patient refused referral  Patient has been referred for addiction treatment: Pt. refused referral  Harden Mo, LCSW 06/13/2018, 1:25 PM

## 2018-06-13 NOTE — [facility] Suicide Risk Assessment (Signed)
[facility] Discharge Suicide Risk Assessment   Principal Problem: Schizophrenia [facility]) Discharge Diagnoses: Principal Problem:   Schizophrenia (HCC) Active Problems:   Marijuana abuse, continuous   Alcohol use disorder, severe, dependence (HCC)   Total Time spent with patient: 45 minutes  Musculoskeletal: Strength & Muscle Tone: within normal limits Gait & Station: normal Patient leans: N/A  Psychiatric Specialty Exam: Review of Systems  Constitutional: Negative.   HENT: Negative.   Eyes: Negative.   Respiratory: Negative.   Cardiovascular: Negative.   Gastrointestinal: Negative.   Musculoskeletal: Negative.   Skin: Negative.   Neurological: Negative.   Psychiatric/Behavioral: Negative for depression, hallucinations, memory loss, substance abuse and suicidal ideas. The patient is not nervous/anxious and does not have insomnia.     Blood pressure 110/82, pulse 86, temperature 98.6 F (37 C), temperature source Oral, resp. rate 18, height 6\' 2"  (1.88 m), weight 85.7 kg, SpO2 100 %.Body mass index is 24.27 kg/m.  General Appearance: Casual  Eye Contact::  Good  Speech:  Normal Rate409  Volume:  Normal  Mood:  Irritable  Affect:  Congruent  Thought Process:  Coherent  Orientation:  Full (Time, Place, and Person)  Thought Content:  Logical  Suicidal Thoughts:  No  Homicidal Thoughts:  No  Memory:  Immediate;   Fair Recent;   Fair Remote;   Fair  Judgement:  Fair  Insight:  Fair  Psychomotor Activity:  Normal  Concentration:  Fair  Recall:  [employer] of Knowledge:Fair  Language: Fair  Akathisia:  No  Handed:  Right  AIMS (if indicated):     Assets:  Desire for Improvement Physical Health Resilience  Sleep:  Number of Hours: 8.15  Cognition: WNL  ADL's:  Intact   Mental Status Per Nursing Assessment::   On Admission:  NA  Demographic Factors:  Male  Loss Factors: Financial problems/change in socioeconomic status  Historical Factors: Impulsivity  Risk  Reduction Factors:   Living with another person, especially a relative and Positive therapeutic relationship  Continued Clinical Symptoms:  Schizophrenia:   Paranoid or undifferentiated type  Cognitive Features That Contribute To Risk:  Loss of executive function    Suicide Risk:  Minimal: No identifiable suicidal ideation.  Patients presenting with no risk factors but with morbid ruminations; may be classified as minimal risk based on the severity of the depressive symptoms    Plan Of Care/Follow-up recommendations:  Activity:  Activity as tolerated Diet:  Regular diet Other:  Patient has active follow-up with the [employer] act team and peers support and is cooperative with their plan.  He is certain he can find a place to stay.  He will be discharged with follow-up through [facility] on his current medicine regimen.  Mordecai Rasmussen, MD 06/13/2018, 9:14 AM

## 2018-06-13 NOTE — [facility] Counselor (Signed)
CSW spoke with the patients Peer Support at [name], [name],  who reports at this time the patient will not be able to be transported to his cousin Tiny house as the patient planned.  [name] reports that the patient will need to provide an address to his cousin's house.   Penni Homans, MSW, LCSW 06/13/2018 11:49 AM

## 2018-06-13 NOTE — Progress Notes (Signed)
Recreation Therapy Notes  Date: 06/13/2018  Time: 9:30 am  Location: Craft Room  Behavioral response: Appropriate  Intervention Topic: Teamwork  Discussion/Intervention:  Group content on today was focused on teamwork. The group identified what teamwork is. Individuals described who is a part of their team. Patients expressed why they thought teamwork is important. The group stated reasons why they thought it was easier to work with a Comptroller team. Individuals discussed some positives and negatives of working with a team. Patients gave examples of past experiences they had while working with a team. The group participated in the intervention "What is That", where patients were given a chance to point out the qualities, they look for in a teammate and were able to work in teams with each other. Clinical Observations/Feedback:  Patient came to group and defined team work as working together to accomplish a goal. He explained working in a group gets things done faster; but he would prefer to work in a smaller group. Participant identified his peer support as a part of his team. Individual was social with peers and staff while participating in the intervention. Blaike Vickers LRT/CTRS         Huda Petrey 06/13/2018 11:41 AM

## 2018-06-13 NOTE — B[facility] Counselor (Signed)
CSW spoke with the patient regarding the information provided by the Peer Support Mickel Baas that the patient would need to provide an address to his cousins and take a cab directly there due to [facility] being unable to provide transportation due to the Covid-19 pandemic.  Patient became upset as evidenced by statements, yelling, threatening others, posture and tone.  CSW offered to allow the patient to speak with Mickel Baas as she was the one who provided the information and patient reports that this CSW was lying.    CSW called Mickel Baas on speakerphone for the patient to speak to.  Patient began to yell, scream, curse at and talk over the Peer Support.  Patient was unable to be redirected from anger.  Patient maintained the aggressive posture, cursing, and yelling though did not move aggressively toward this CSW or make threats directed at her.  Patient did make statements of "I'm going to end this sh*t. I am over this.  I am no longer working with Donell Sievert as of tomorrow.  I am going to kill somebody.  Why am I here? To eat? Sleep? Ain't doing nothing to help me anyway."  CSW stepped out of patient's room and informed Peer Support that she would address the behavior and concerns and statements made by the patient with the psychiatrist and follow up.   CSW met with Dr. Weber Cooks and discussed concerns and behaviors of the patient.  CSW mentioned concern for the patient being released with the aggressive behavior.  CSW pointed out that patient will only give, or perhaps does not know another address.  CSW expressed concerns as the parents have indicated that the patient is not to return to his parents home.    CSW observed the psychiatrist to go talk to patient.    Assunta Curtis, MSW, LCSW 06/13/2018 11:56 AM

## 2018-06-13 NOTE — [facility] Counselor (Signed)
CSW spoke with Peer Support Mickel Baas to follow up on the patient being discharged.  Mickel Baas expressed concerns that patient would be discharged despite aggressive behavior in the morning.  CSW conformed that patient would be discharged and patient had opportunity to meet with psychiatrist following earlier aggression.  Mickel Baas asked that CSW give call to Team [facility] [phone].  CSW called the Team Lead and informed patient would be discharged.  Team Lead expressed concern that patient would be discharged following his aggressive behavior.  CSW informed that pt and psychiatrist met and recommendation remained discharge.  CSW encouraged Team Lead to follow up with Dr. Weber Cooks for any additional questions.     Assunta Curtis, MSW, LCSW 06/13/2018 1:16 PM

## 2018-06-13 NOTE — Progress Notes (Signed)
Recreation Therapy Notes  INPATIENT RECREATION TR PLAN  Patient Details Name: Larry Walter MRN: [id number] DOB: 12/16/1975 Today's Date: 06/13/2018  Rec Therapy Plan Is patient appropriate for Therapeutic Recreation?: Yes Treatment times per week: at least 3 Estimated Length of Stay: 5-7 days TR Treatment/Interventions: Group participation (Comment)  Discharge Criteria Pt will be discharged from therapy if:: Discharged Treatment plan/goals/alternatives discussed and agreed upon by:: Patient/family  Discharge Summary Short term goals set: Patient will engage in groups without prompting or encouragement from LRT x3 group sessions within 5 recreation therapy group sessions Short term goals met: Adequate for discharge Progress toward goals comments: Groups attended Which groups?: Other (Comment)(Team work, decision making) Reason goals not met: N/A Therapeutic equipment acquired: N/A Reason patient discharged from therapy: Discharge from hospital Pt/family agrees with progress & goals achieved: Yes Date patient discharged from therapy: 06/13/18      06/13/2018, 12:54 PM  

## 2018-06-13 NOTE — Discharge Summary (Signed)
Physician Discharge Summary Note  Patient:  Larry Walter is an 43 y.o., male MRN:  [id number] DOB:  10/15/1975 Patient phone:  [phone] (home)  Patient address:   [address] [place] [id number],  Total Time spent with patient: 1 hour  Date of Admission:  06/09/2018 Date of Discharge: June 13, 2018  Reason for Admission: Patient was admitted because of behaviors in public including walking around with a knife and allegedly being threatening to his father and family of origin.  Principal Problem: Schizophrenia [facility](HCC) Discharge Diagnoses: Principal Problem:   Schizophrenia (HCC) Active Problems:   Marijuana abuse, continuous   Alcohol use disorder, severe, dependence (HCC)   Past Psychiatric History: He has a history of schizophrenia.  History of threatening aggressive behavior particularly when off of his medicine.  No known history of suicidal behavior.  He generally is cooperative with his act team from [facility] and has shown pretty good response when he stays on Haldol decanoate  Past Medical History:  Past Medical History:  Diagnosis Date  . Anxiety   . Bipolar disorder (HCC)   . Depression   . Heart murmur   . Hypertension    History reviewed. No pertinent surgical history. Family History: History reviewed. No pertinent family history. Family Psychiatric  History: None reported Social History:  Social History   Substance and Sexual Activity  Alcohol Use Yes     Social History   Substance and Sexual Activity  Drug Use Yes  . Types: Marijuana    Social History   Socioeconomic History  . Marital status: Single    Spouse name: Not on file  . Number of children: Not on file  . Years of education: Not on file  . Highest education level: Not on file  Occupational History  . Not on file  Social Needs  . Financial resource strain: Not on file  . Food insecurity:    Worry: Not on file    Inability: Not on file  . Transportation needs:     Medical: Not on file    Non-medical: Not on file  Tobacco Use  . Smoking status: Current Every Day Smoker  . Smokeless tobacco: Former Engineer, water[employer]  Substance and Sexual Activity  . Alcohol use: Yes  . Drug use: Yes    Types: Marijuana  . Sexual activity: Not on file  Lifestyle  . Physical activity:    Days per week: Not on file    Minutes per session: Not on file  . Stress: Not on file  Relationships  . Social connections:    Talks on phone: Not on file    Gets together: Not on file    Attends religious service: Not on file    Active member of club or organization: Not on file    Attends meetings of clubs or organizations: Not on file    Relationship status: Not on file  Other Topics Concern  . Not on file  Social History Narrative  . Not on file    Hospital Course: Patient was admitted to the hospital.  He was irritable and grumpy as usual but not threatening towards staff and was generally cooperative.  He has a tendency to get irritable with other patients but fortunately did not engage in any actual violence in the hospital.  I spoke with his act team and following the recommendation of Dr. Alla GermanPierre got the patient on 2 Haldol decanoate 300 mg IM which was given the other day.  Patient  had no complaint and has tolerated it without difficulty.  He is also getting some Seroquel at night although it seems to be a longstanding issue that he prefers not to take oral medicine outside the hospital.  Patient appears to be at his baseline.  He does not show signs of clear psychosis.  He gets grumpy and irritable particularly with other patients who are psychotic who are intrusive on him but this appears to be more of his baseline personality.  Patient is unlikely to show any further improvement I think in the inpatient setting.  He is denying any intention to harm anyone outside the hospital.  Denies any suicidal thought.  He is agreeable to continued follow-up with his act team from [facility].   Prescriptions will be prepared.  Next dose of Haldol Decanoate probably due around May 11.  Patient can be discharged today.  His family of origin has made it clear that they do not want him returning home there but the patient says that he feels he will have no trouble finding places to stay because he has plenty of friends out "on the street".  Case reviewed with nursing and social work.  Discontinue IVC.  Released to outpatient care.  Physical Findings: AIMS:  , ,  ,  ,    CIWA:  CIWA-Ar Total: 0 COWS:     Musculoskeletal: Strength & Muscle Tone: within normal limits Gait & Station: normal Patient leans: N/A  Psychiatric Specialty Exam: Physical Exam  Nursing note and vitals reviewed. Constitutional: He appears well-developed and well-nourished.  HENT:  Head: Normocephalic and atraumatic.  Eyes: Pupils are equal, round, and reactive to light. Conjunctivae are normal.  Neck: Normal range of motion.  Cardiovascular: Regular rhythm and normal heart sounds.  Respiratory: Effort normal. No respiratory distress.  GI: Soft.  Musculoskeletal: Normal range of motion.  Neurological: He is alert.  Skin: Skin is warm and dry.  Psychiatric: He has a normal mood and affect. His speech is normal and behavior is normal. Judgment and thought content normal. Cognition and memory are normal.    Review of Systems  Constitutional: Negative.   HENT: Negative.   Eyes: Negative.   Respiratory: Negative.   Cardiovascular: Negative.   Gastrointestinal: Negative.   Musculoskeletal: Negative.   Skin: Negative.   Neurological: Negative.   Psychiatric/Behavioral: Negative.     Blood pressure 110/82, pulse 86, temperature 98.6 F (37 C), temperature source Oral, resp. rate 18, height 6\' 2"  (1.88 m), weight 85.7 kg, SpO2 100 %.Body mass index is 24.27 kg/m.  General Appearance: Fairly Groomed  Eye Contact:  Good  Speech:  Clear and Coherent  Volume:  Normal  Mood:  Irritable  Affect:  Congruent   Thought Process:  Goal Directed  Orientation:  Full (Time, Place, and Person)  Thought Content:  Logical  Suicidal Thoughts:  No  Homicidal Thoughts:  No  Memory:  Immediate;   Fair Recent;   Fair Remote;   Fair  Judgement:  Fair  Insight:  Fair  Psychomotor Activity:  Normal  Concentration:  Concentration: Fair  Recall:  [employer] of Knowledge:  Fair  Language:  Fair  Akathisia:  No  Handed:  Right  AIMS (if indicated):     Assets:  Desire for Improvement Physical Health Resilience  ADL's:  Intact  Cognition:  WNL  Sleep:  Number of Hours: 8.15     Have you used any form of tobacco in the last 30 days? (Cigarettes, Smokeless  Tobacco, Cigars, and/or Pipes): Yes  Has this patient used any form of tobacco in the last 30 days? (Cigarettes, Smokeless Tobacco, Cigars, and/or Pipes) Yes, Yes, A prescription for an FDA-approved tobacco cessation medication was offered at discharge and the patient refused  Blood Alcohol level:  Lab Results  Component Value Date   ETH <10 06/08/2018   ETH 109 (H) 04/26/2018    Metabolic Disorder Labs:  No results found for: HGBA1C, MPG No results found for: PROLACTIN No results found for: CHOL, TRIG, HDL, CHOLHDL, VLDL, LDLCALC  See Psychiatric Specialty Exam and Suicide Risk Assessment completed by Attending Physician prior to discharge.  Discharge destination:  Home  Is patient on multiple antipsychotic therapies at discharge:  Yes,   Do you recommend tapering to monotherapy for antipsychotics?  Yes   Has Patient had three or more failed trials of antipsychotic monotherapy by history:  No  Recommended Plan for Multiple Antipsychotic Therapies: Taper to monotherapy as described:  Recommend discontinuation of the evening Seroquel once the patient is stable on his current haloperidol decanoate dose.  Discharge Instructions    Diet - low sodium heart healthy   Complete by:  As directed    Increase activity slowly   Complete by:  As  directed      Allergies as of 06/13/2018      Reactions   Penicillins Itching      Medication List    STOP taking these medications   gabapentin 300 MG capsule Commonly known as:  NEURONTIN   haloperidol 10 MG tablet Commonly known as:  HALDOL   traZODone 100 MG tablet Commonly known as:  DESYREL     TAKE these medications     Indication  haloperidol decanoate 100 MG/ML injection Commonly known as:  Haldol Decanoate Inject 3 mLs (300 mg total) into the muscle every 28 (twenty-eight) days. Start taking on:  Jul 02, 2018 What changed:  how much to take  Indication:  Schizophrenia   QUEtiapine 200 MG tablet Commonly known as:  SEROQUEL Take 1 tablet (200 mg total) by mouth at bedtime. What changed:  when to take this  Indication:  Schizophrenia        Follow-up recommendations:  Activity:  Activity as tolerated Diet:  Regular diet Other:  Follow-up outpatient treatment in the community  Comments: Can be discharged to follow-up with his act team.  He is not actively suicidal or homicidal and not showing overt psychosis and is able to make reasonable decisions for himself.  Signed: Mordecai Rasmussen, MD 06/13/2018, 9:18 AM

## 2018-06-13 NOTE — [facility] Counselor (Signed)
CSW received call from [employer].  Nurse Danella Deis reports that the patient has provided an address for his sister [address] and sister has given permission for the patient to return to get his clothing and truck.   CSW reported that this is the address for his parents and reiterated the parent's concerns.  Patient is still scheduled to be discharged.   Penni Homans, MSW, LCSW 06/13/2018 12:18 PM

## 2018-06-13 NOTE — [facility] Counselor (Signed)
CSW called the patient's mother to confirm that the patient is able to return to get his items.    Penni Homans, MSW, LCSW 06/13/2018 1:08 PM

## 2020-03-07 IMAGING — CT CT HEAD W/O CM
3 series · 16 of 47 positions shown, 19 images · non-contrast
Comparison: None.

CLINICAL DATA: Altered mental status.

EXAM:
CT HEAD WITHOUT CONTRAST
TECHNIQUE: Contiguous axial images were obtained from the base of the skull
through the vertex without intravenous contrast.

[Series 2: head wo · axial · 0.44mm/px · z∈[+991,+1116]mm · 10 of 30 slices shown, 13 images]
[im 3/30  brain]
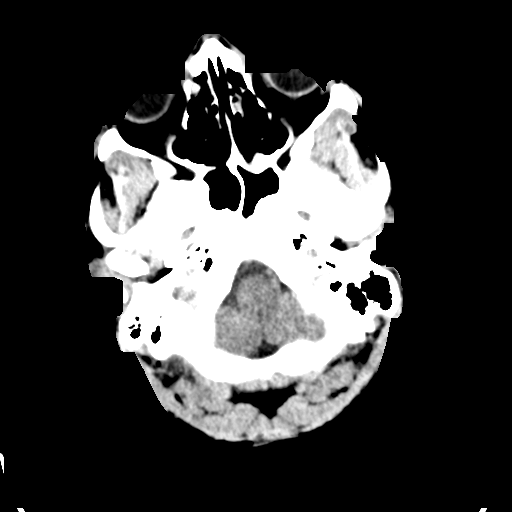
[im 3/30  bone]
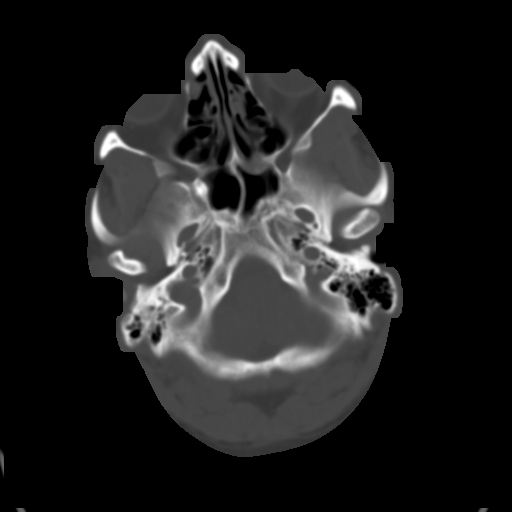
[im 6/30  brain]
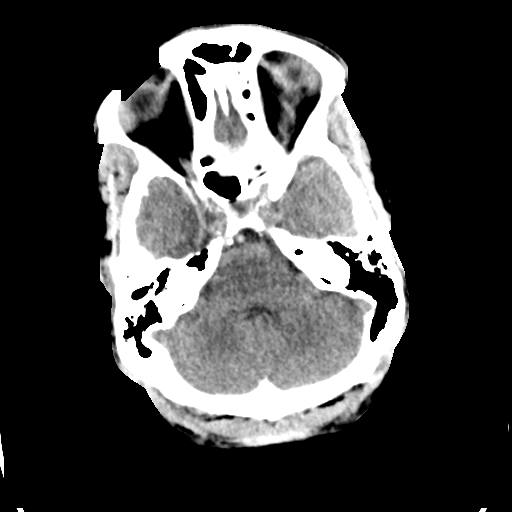
[im 9/30  brain]
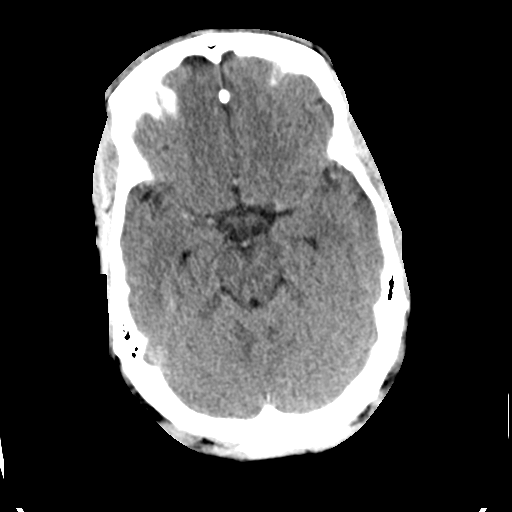
[im 11/30  brain]
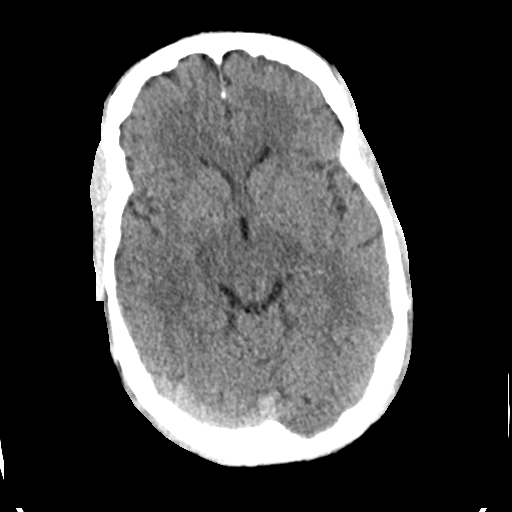
[im 14/30  brain]
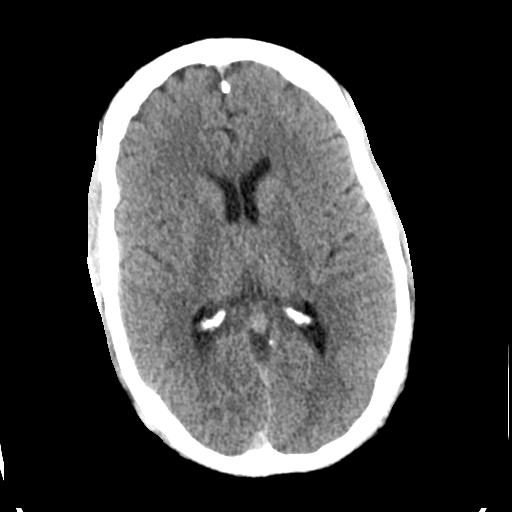
[im 14/30  bone]
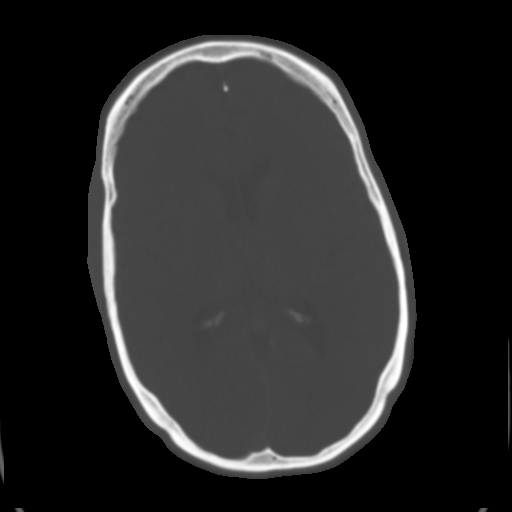
[im 17/30  brain]
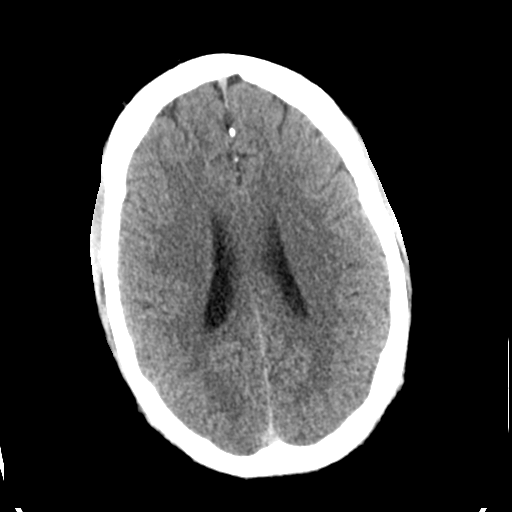
[im 20/30  brain]
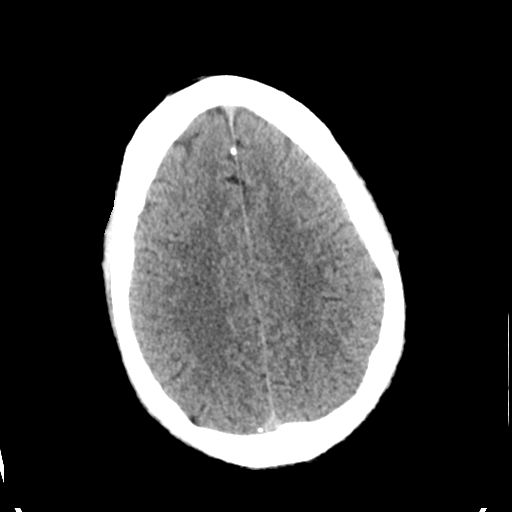
[im 23/30  brain]
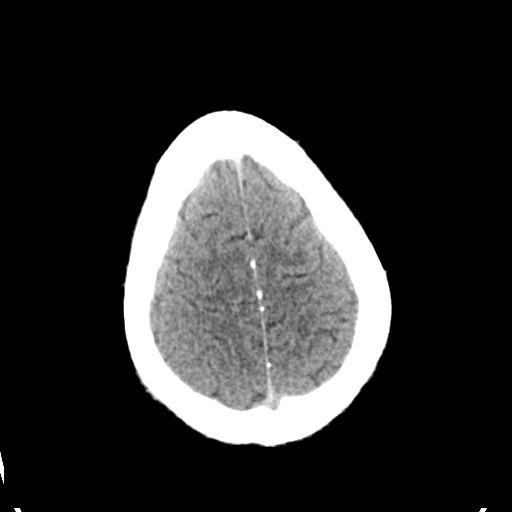
[im 25/30  brain]
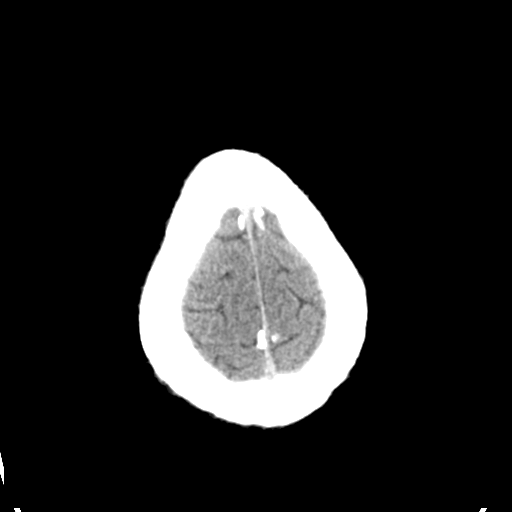
[im 25/30  bone]
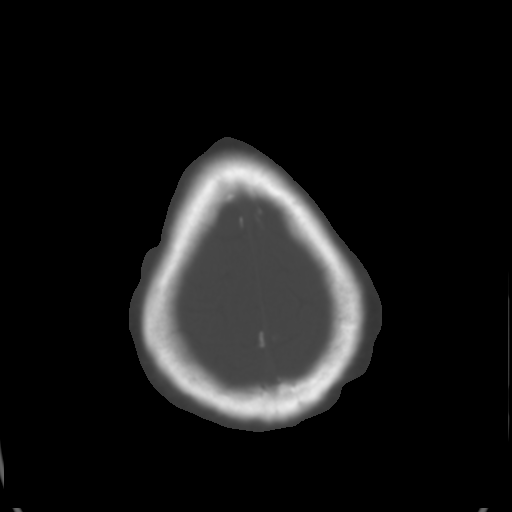
[im 28/30  brain]
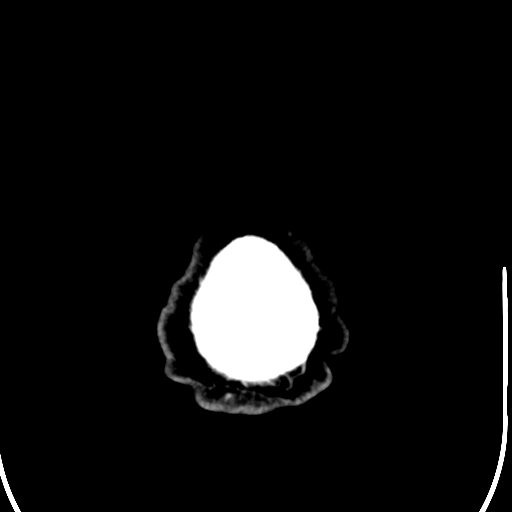

[Series 4: coronal soft tissue · coronal · 0.36mm/px · 3 of 69 slices shown]
[im 23/69  brain]
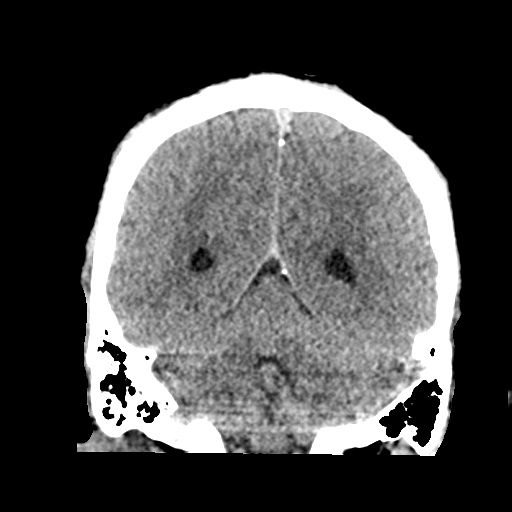
[im 31/69  brain]
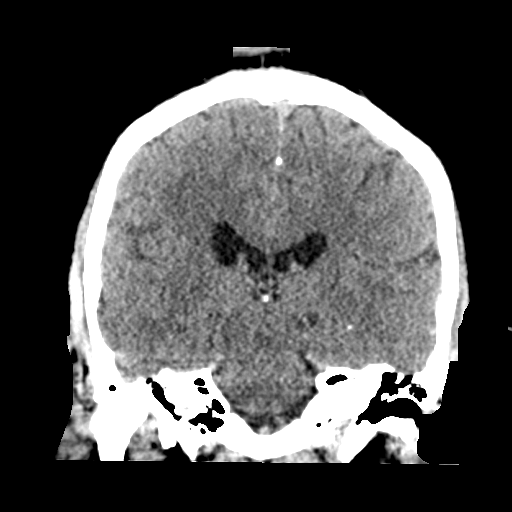
[im 38/69  brain]
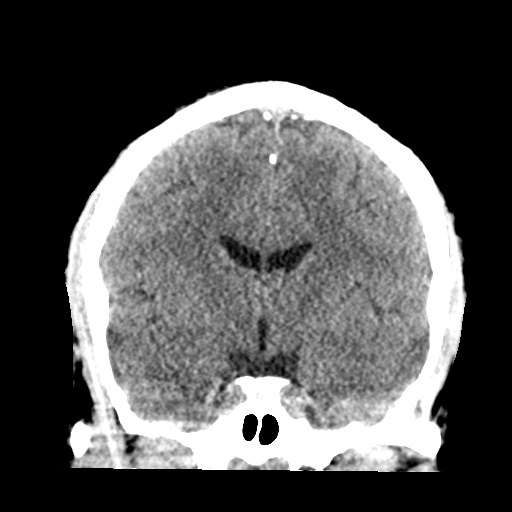

[Series 5: sagittal soft tissue · sagittal · 0.31mm/px · 3 of 53 slices shown]
[im 18/53  brain]
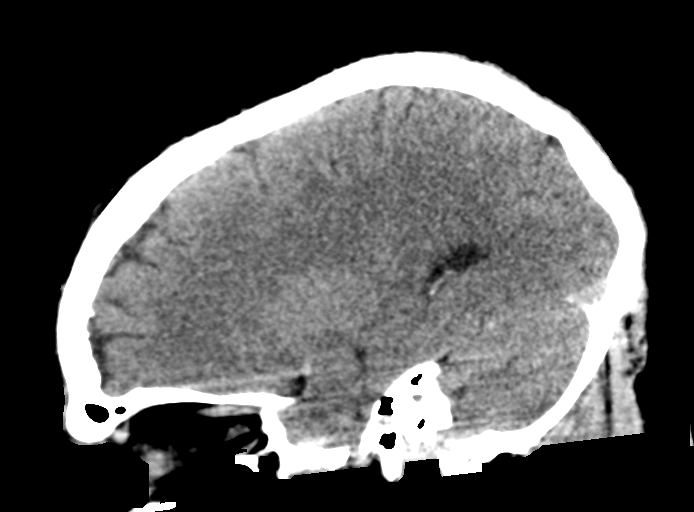
[im 27/53  brain]
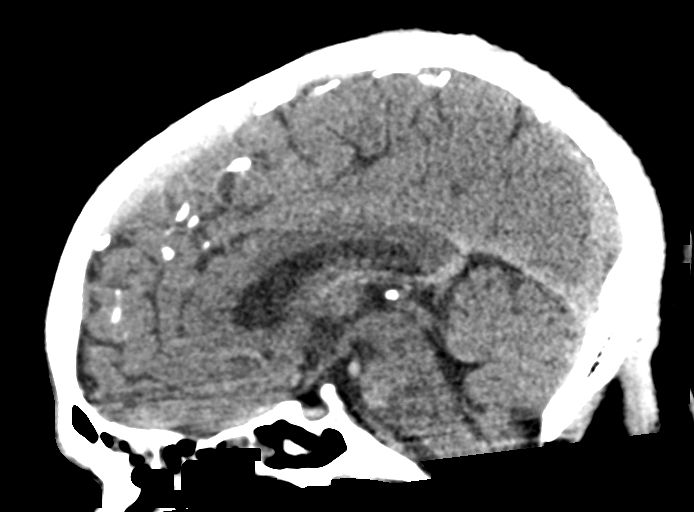
[im 35/53  brain]
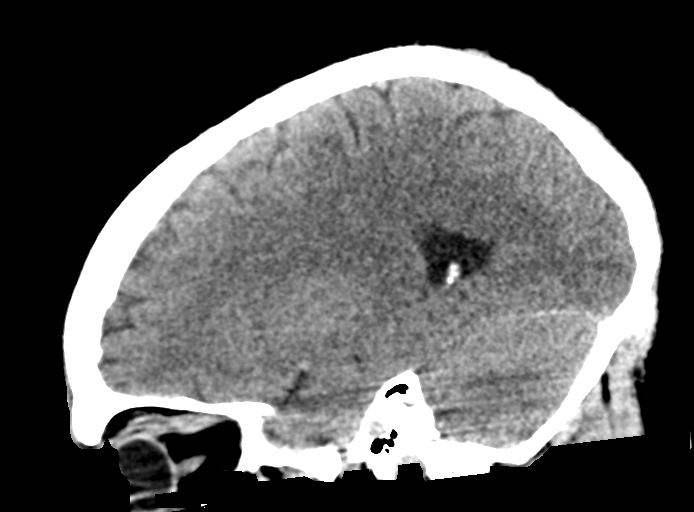

[16 of 47 positions shown; findings below may reference images not displayed]

FINDINGS: Brain: There is no evidence of acute infarct, intracranial
hemorrhage, mass, midline shift, or extra-axial fluid collection.
The ventricles and sulci are normal.

Vascular: No hyperdense vessel.

Skull: No fracture or focal osseous lesion.

Sinuses/Orbits: Mild mucosal thickening in the included paranasal
sinuses. Small right mastoid effusion. Unremarkable orbits.

Other: None.
IMPRESSION: Unremarkable CT appearance of the brain.
